# Patient Record
Sex: Male | Born: 1960 | Race: White | Hispanic: No | Marital: Married | Smoking: Never smoker
Health system: Southern US, Community
[De-identification: ages and names within clinical notes are randomized; demographics above are authoritative.]

## PROBLEM LIST (undated history)

## (undated) DIAGNOSIS — I10 Essential (primary) hypertension: Secondary | ICD-10-CM

## (undated) DIAGNOSIS — E119 Type 2 diabetes mellitus without complications: Secondary | ICD-10-CM

## (undated) DIAGNOSIS — E079 Disorder of thyroid, unspecified: Secondary | ICD-10-CM

---

## 1998-06-02 ENCOUNTER — Emergency Department (HOSPITAL_COMMUNITY): Admission: EM | Admit: 1998-06-02 | Discharge: 1998-06-02 | Payer: Self-pay | Admitting: Emergency Medicine

## 2001-03-27 ENCOUNTER — Emergency Department (HOSPITAL_COMMUNITY): Admission: EM | Admit: 2001-03-27 | Discharge: 2001-03-27 | Payer: Self-pay | Admitting: Emergency Medicine

## 2001-05-06 ENCOUNTER — Ambulatory Visit (HOSPITAL_COMMUNITY): Admission: RE | Admit: 2001-05-06 | Discharge: 2001-05-06 | Payer: Self-pay | Admitting: Internal Medicine

## 2006-01-06 ENCOUNTER — Encounter: Admission: RE | Admit: 2006-01-06 | Discharge: 2006-01-06 | Payer: Self-pay | Admitting: Internal Medicine

## 2006-08-18 ENCOUNTER — Encounter: Admission: RE | Admit: 2006-08-18 | Discharge: 2006-08-18 | Payer: Self-pay | Admitting: Internal Medicine

## 2007-12-07 ENCOUNTER — Encounter: Admission: RE | Admit: 2007-12-07 | Discharge: 2007-12-07 | Payer: Self-pay | Admitting: Internal Medicine

## 2009-05-18 IMAGING — CR DG RIBS W/ CHEST 3+V*L*
4 series · 4 of 4 positions shown · non-contrast
Comparison: none

CLINICAL DATA: Left rib pain

Left ribs with chest:
Comparison 01/06/2006. Frontal chest film shows no pneumothorax, effusion, or
contusion. Three detailed views of left ribs with skin marker showed no
displaced fracture or other focal lesion.

[w chest pa]
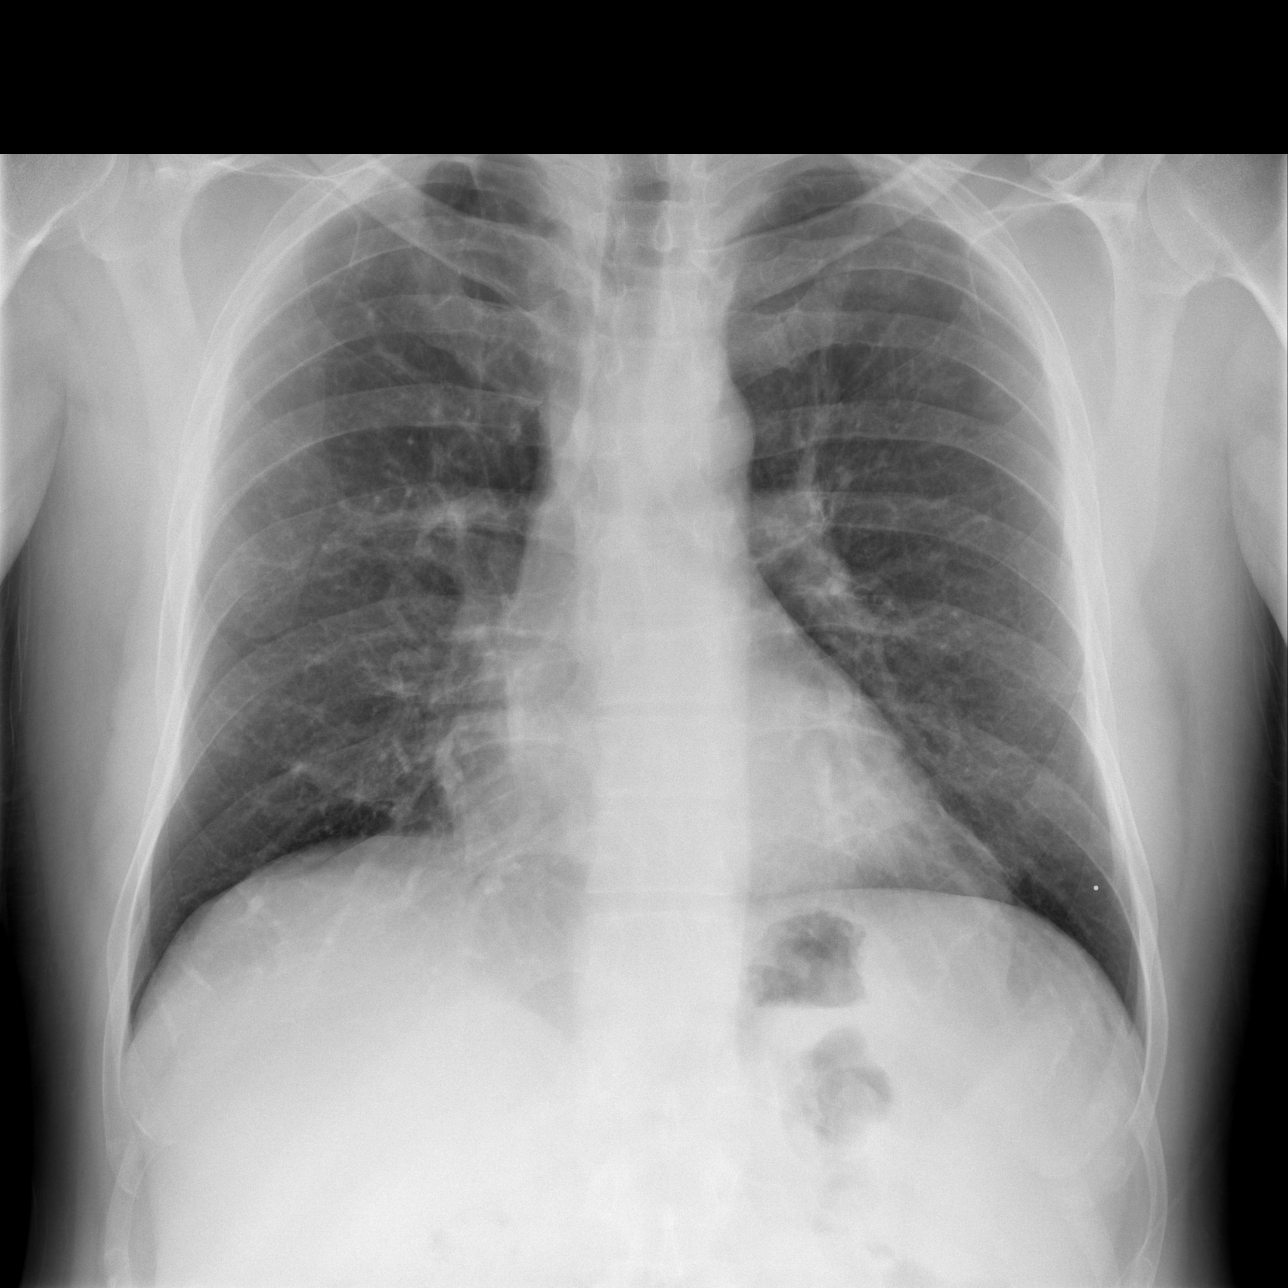

[w ribs ap/pa upper left *]
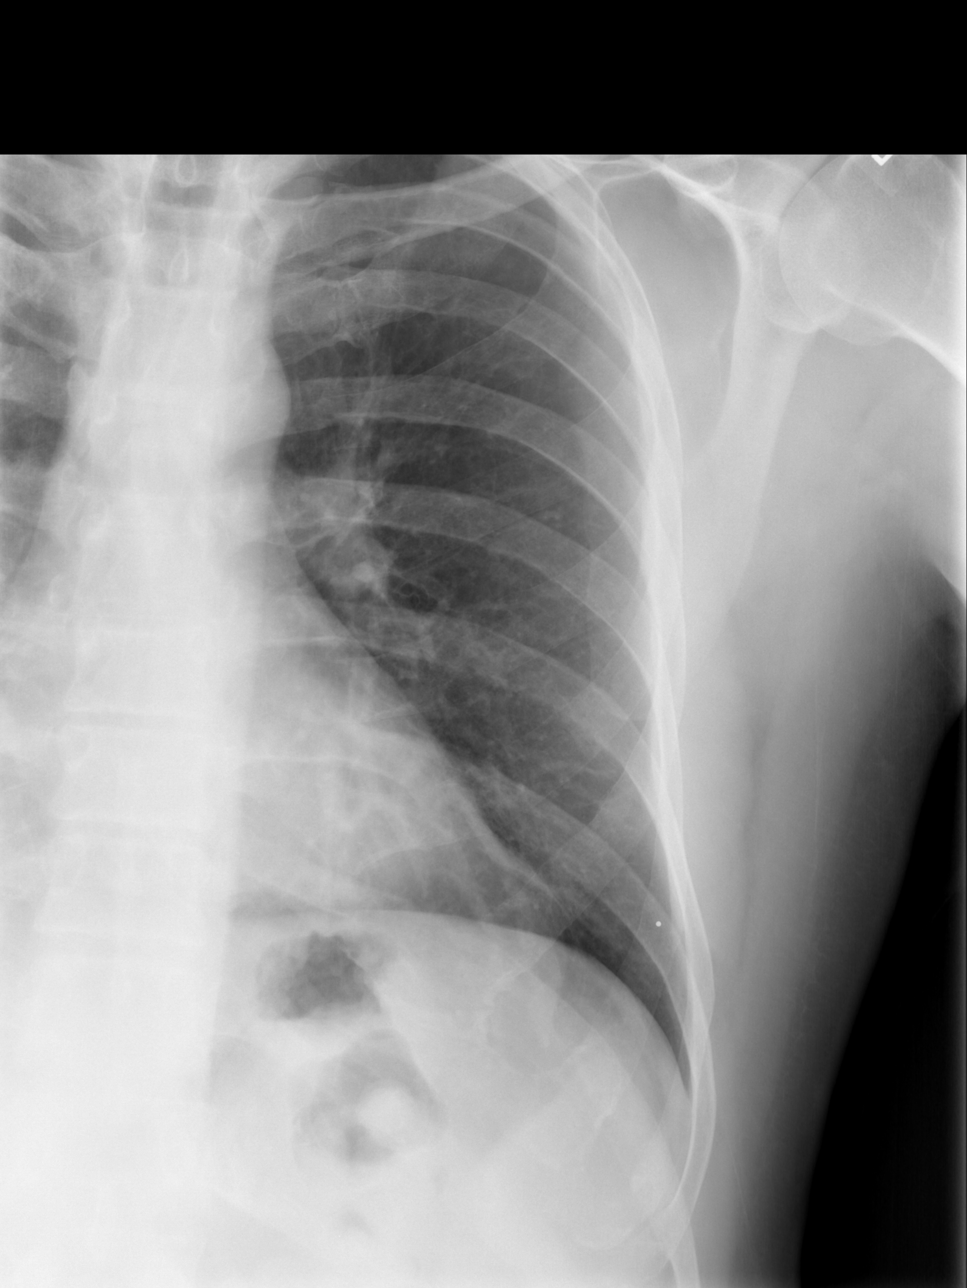

[w ribs ap/pa lower left *]
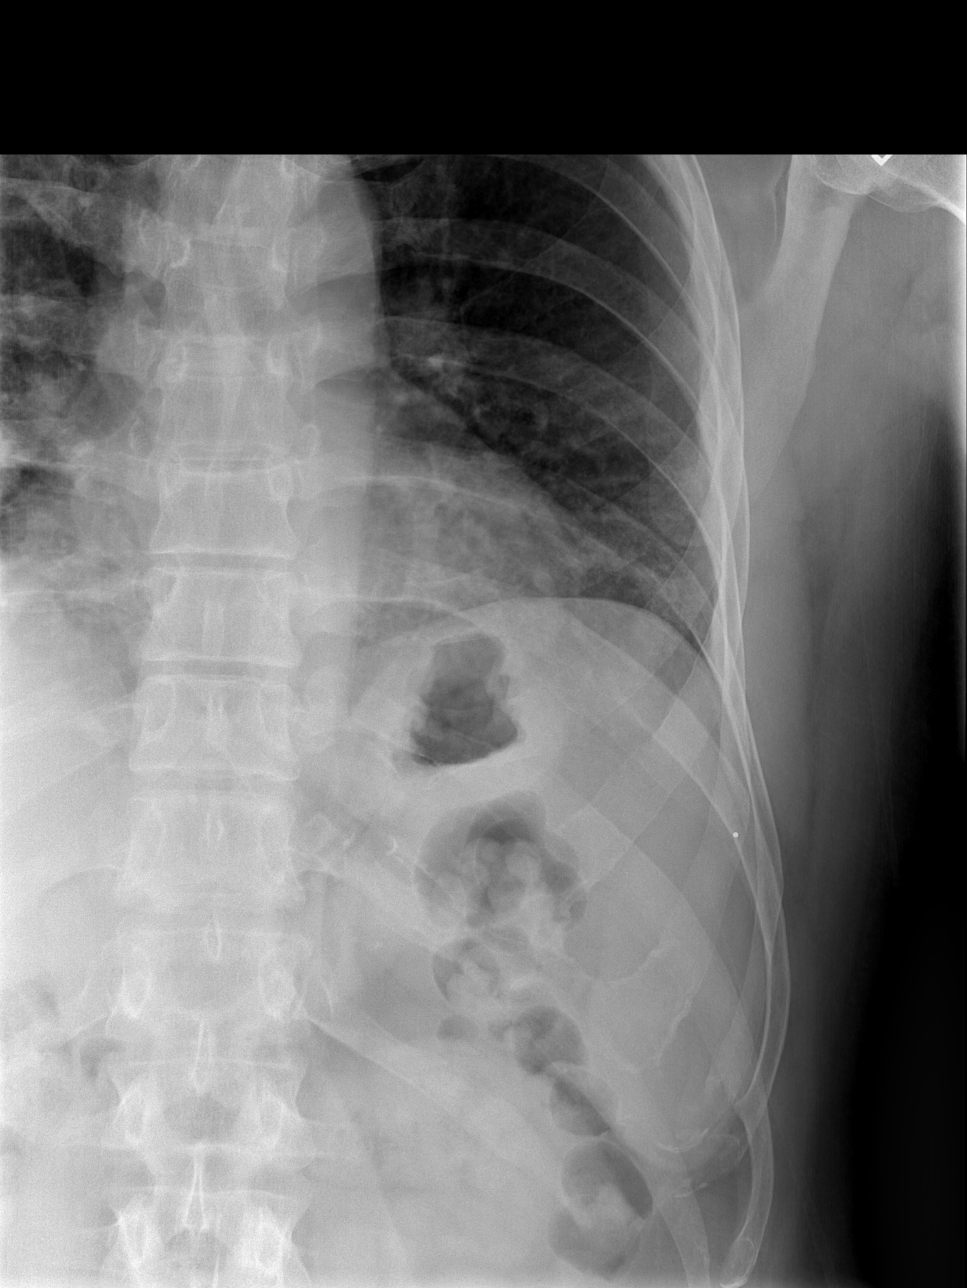

[w ribs oblique left *]
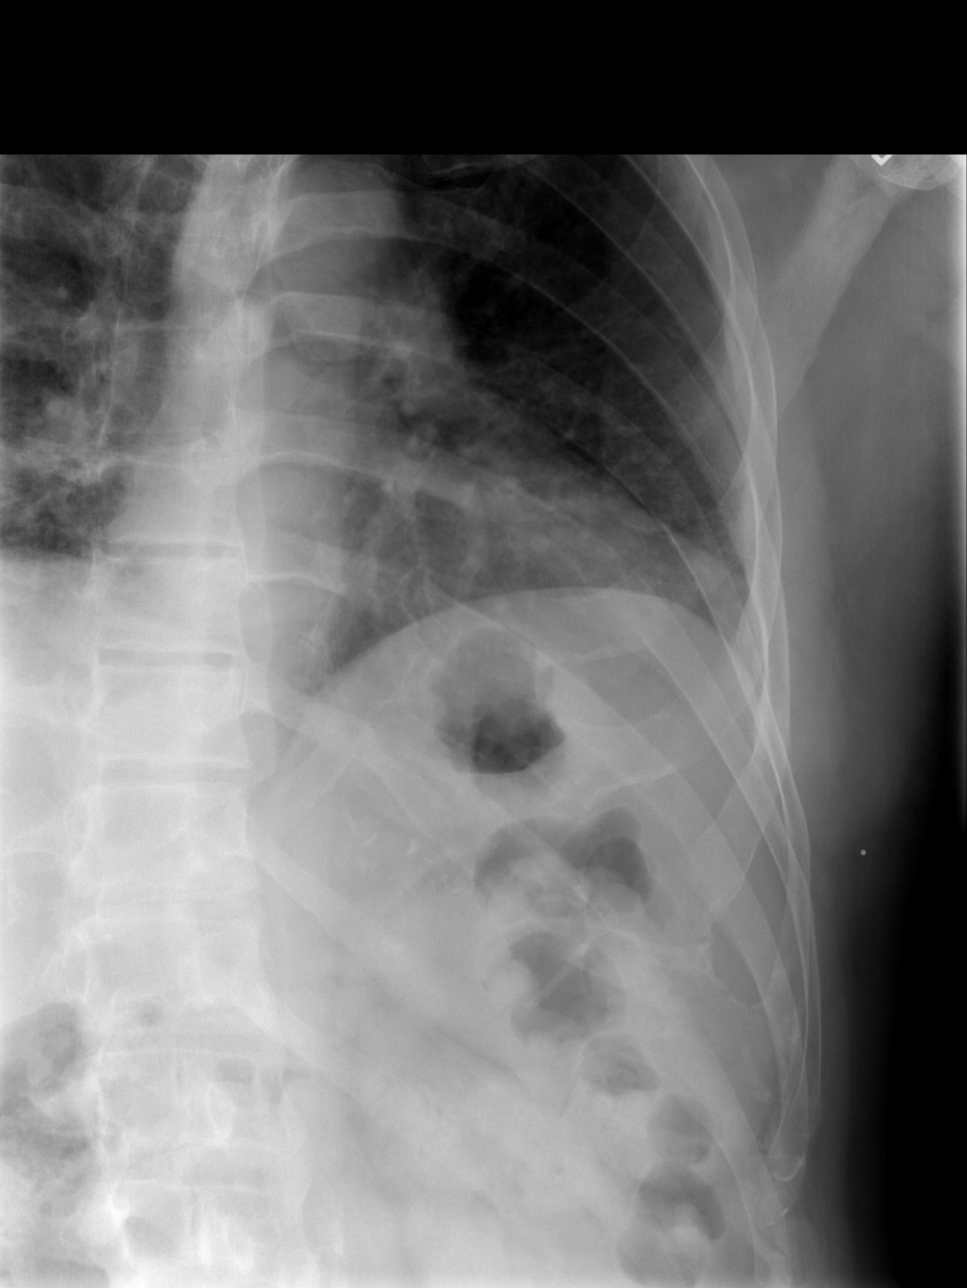

[4 of 4 positions shown; findings below may reference images not displayed]

IMPRESSION: 1. Negative

## 2014-05-02 ENCOUNTER — Encounter: Payer: Self-pay | Admitting: Internal Medicine

## 2016-06-23 ENCOUNTER — Ambulatory Visit (HOSPITAL_COMMUNITY)
Admission: EM | Admit: 2016-06-23 | Discharge: 2016-06-23 | Disposition: A | Discharge status: Home / Self Care | Payer: 59 | Attending: Family Medicine | Admitting: Family Medicine

## 2016-06-23 ENCOUNTER — Encounter (HOSPITAL_COMMUNITY): Payer: Self-pay | Admitting: Family Medicine

## 2016-06-23 DIAGNOSIS — S0501XA Injury of conjunctiva and corneal abrasion without foreign body, right eye, initial encounter: Secondary | ICD-10-CM

## 2016-06-23 MED ORDER — TOBRAMYCIN 0.3 % OP OINT: 1.0000 "application " | TOPICAL_OINTMENT | Freq: Three times a day (TID) | OPHTHALMIC | 0 refills | Status: DC

## 2016-06-23 MED ORDER — TETRACAINE HCL 0.5 % OP SOLN
OPHTHALMIC | Status: AC
  Filled 2016-06-23: qty 2

## 2016-06-23 NOTE — ED Triage Notes (Signed)
Pt reports   He  Was  [employer]   Cutting  Yesterday   And   a  Development worker, communityiece   Of  Material  Got  Into    His  r  Eye  He  Feels  Like  It  May  Be  Scratched   It  Is  irriatted   And  causing  Symptoms

## 2016-06-23 NOTE — ED Provider Notes (Signed)
[facility]    CSN: [id number] Arrival date & time: 06/23/16  1549  First Provider Contact:  First MD Initiated Contact with Patient 06/23/16 1630        History   Chief Complaint Chief Complaint  Patient presents with  . Eye Injury    HPI Ronald Bauer is a 55 y.o. male.   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 The onset of the pain was associated with cutting grass and he believes that a foreign body was able to fly up between his eyeglasses and his eye. He has a foreign body sensation now in the right eye and has blepharospasm when he is out in the sunlight.  He's had no obvious change in his vision. Left eye is unaffected. Eyes been watering ever since.      History reviewed. No pertinent past medical history.  There are no active problems to display for this patient.   History reviewed. No pertinent surgical history.     Home Medications    Prior to Admission medications   Medication Sig Start Date End Date Taking? Authorizing Provider  insulin aspart (NOVOLOG) 100 unit/mL injection Inject 10 Units into the skin 3 (three) times daily before meals.   Yes Historical Provider, MD  simvastatin (ZOCOR) 20 MG tablet Take 20 mg by mouth daily.   Yes Historical Provider, MD  tobramycin (TOBREX) 0.3 % ophthalmic ointment Place 1 application into the right eye 3 (three) times daily. 06/23/16   Elvina SidleKurt Kijuan Gallicchio, MD    Family History History reviewed. No pertinent family history.  Social History Social History  Substance Use Topics  . Smoking status: Not on file  . Smokeless tobacco: Not on file  . Alcohol use Not on file     Allergies   Review of patient's allergies indicates not on file.   Review of Systems Review of Systems   Physical Exam Triage Vital Signs ED Triage Vitals  Enc Vitals Group     BP      Pulse      Resp      Temp      Temp src      SpO2    Weight      Height      Head Circumference      Peak Flow      Pain Score      Pain Loc      Pain Edu?      Excl. in GC?    No data found.   Updated Vital Signs There were no vitals taken for this visit.  Visual Acuity Right Eye Distance:   Left Eye Distance:   Bilateral Distance:    Right Eye Near:   Left Eye Near:    Bilateral Near:     Physical Exam  Constitutional: He appears well-developed and well-nourished.  Eyes:  Inspection of the eye reveals no foreign body. He has some injection and watering. The eyelids are normal. Extraocular movement is normal and pupils are equal and reactive.  Right eye was anesthetized after the lid was everted and no foreign body was seen. Patient complete relief of her pain. DI was then stained with floor seen and patient was examined with a slit lamp. There were 2 small (2-3 mm) areas of blush at 7:00.  I was then irrigated with saline copiously.  Nursing note and vitals reviewed.  UC Treatments / Results  Labs (all labs ordered are listed, but only abnormal results are displayed) Labs Reviewed - No data to display  EKG  EKG Interpretation None       Radiology No results found.  Procedures Procedures (including critical care time)  Medications Ordered in UC Medications - No data to display   Initial Impression / Assessment and Plan / UC Course  I have reviewed the triage vital signs and the nursing notes.  Pertinent labs & imaging results that were available during my care of the patient were reviewed by me and considered in my medical decision making (see chart for details).  Clinical Course   Slit-lamp exam  Final Clinical Impressions(s) / UC Diagnoses   Final diagnoses:  Injury of conjunctiva and corneal abrasion of right eye without foreign body, initial encounter    New Prescriptions New Prescriptions   TOBRAMYCIN (TOBREX) 0.3 % OPHTHALMIC OINTMENT    Place 1 application into the right eye 3 (three)  times daily.     Elvina SidleKurt Maisen Klingler, MD 06/23/16 1705

## 2016-06-23 NOTE — Discharge Instructions (Signed)
Please return if discomfort is not significantly improved by morning.

## 2018-01-08 ENCOUNTER — Emergency Department (HOSPITAL_BASED_OUTPATIENT_CLINIC_OR_DEPARTMENT_OTHER)
Admission: EM | Admit: 2018-01-08 | Discharge: 2018-01-08 | Disposition: A | Discharge status: Home / Self Care | Payer: 59 | Attending: Emergency Medicine | Admitting: Emergency Medicine

## 2018-01-08 ENCOUNTER — Other Ambulatory Visit: Payer: Self-pay

## 2018-01-08 ENCOUNTER — Encounter (HOSPITAL_BASED_OUTPATIENT_CLINIC_OR_DEPARTMENT_OTHER): Payer: Self-pay | Admitting: *Deleted

## 2018-01-08 DIAGNOSIS — Z79899 Other long term (current) drug therapy: Secondary | ICD-10-CM | POA: Diagnosis not present

## 2018-01-08 DIAGNOSIS — E119 Type 2 diabetes mellitus without complications: Secondary | ICD-10-CM | POA: Diagnosis not present

## 2018-01-08 DIAGNOSIS — I10 Essential (primary) hypertension: Secondary | ICD-10-CM | POA: Insufficient documentation

## 2018-01-08 DIAGNOSIS — J069 Acute upper respiratory infection, unspecified: Secondary | ICD-10-CM

## 2018-01-08 DIAGNOSIS — Z794 Long term (current) use of insulin: Secondary | ICD-10-CM | POA: Insufficient documentation

## 2018-01-08 DIAGNOSIS — R05 Cough: Secondary | ICD-10-CM | POA: Diagnosis present

## 2018-01-08 DIAGNOSIS — B9789 Other viral agents as the cause of diseases classified elsewhere: Secondary | ICD-10-CM | POA: Diagnosis not present

## 2018-01-08 HISTORY — DX: Type 2 diabetes mellitus without complications: E11.9

## 2018-01-08 HISTORY — DX: Essential (primary) hypertension: I10

## 2018-01-08 HISTORY — DX: Disorder of thyroid, unspecified: E07.9

## 2018-01-08 LAB — RAPID STREP SCREEN (MED CTR MEBANE ONLY): STREPTOCOCCUS, GROUP A SCREEN (DIRECT): NEGATIVE

## 2018-01-08 MED ORDER — BENZONATATE 100 MG PO CAPS: 100.0000 mg | ORAL_CAPSULE | Freq: Three times a day (TID) | ORAL | 0 refills | Status: DC

## 2018-01-08 MED ORDER — CHLORHEXIDINE GLUCONATE 0.12 % MT SOLN: 15.0000 mL | Freq: Two times a day (BID) | OROMUCOSAL | 0 refills | Status: DC

## 2018-01-08 MED FILL — CHLORHEXIDINE 0.12% RINSE: 0.12 | 16 days supply | Qty: 473 | Fill #0

## 2018-01-08 MED FILL — BENZONATATE 100 MG CAPSULE: 100 | 7 days supply | Qty: 21 | Fill #0

## 2018-01-08 NOTE — ED Provider Notes (Signed)
[facility] EMERGENCY DEPARTMENT Provider Note   CSN: [id number] Arrival date & time: 01/08/18  1430     History   Chief Complaint Chief Complaint  Patient presents with  . Cough    HPI Ronald Bauer is a 57 y.o. male.  HPI   57 year old male with history of diabetes, hypertension, thyroid disease presenting with cold symptoms.  Patient reports since yesterday he noticed scratchy throat, occasional sneeze, and today he also produced a nonproductive cough.  No report of fever, headache, congestion, ear pain, neck pain, chest pain, trouble breathing, hemoptysis.  Wife developed some symptoms.  Patient states he is a strep carrier.  He is requesting of a antibiotic treatment.  He mentioned that his PCP office is closed today.  States he has had his flu shot.  Denies any myalgias.  Past Medical History:  Diagnosis Date  . Diabetes mellitus without complication (HCC)   . Hypertension   . Thyroid disease     There are no active problems to display for this patient.   History reviewed. No pertinent surgical history.     Home Medications    Prior to Admission medications   Medication Sig Start Date End Date Taking? Authorizing Provider  insulin aspart (NOVOLOG) 100 unit/mL injection Inject 10 Units into the skin 3 (three) times daily before meals.   Yes [provider]  Levothyroxine Sodium (SYNTHROID PO) Take by mouth.   Yes [provider]  simvastatin (ZOCOR) 20 MG tablet Take 20 mg by mouth daily.   Yes [provider]  tobramycin (TOBREX) 0.3 % ophthalmic ointment Place 1 application into the right eye 3 (three) times daily. 06/23/16   Elvina SidleLauenstein, Kurt, MD    Family History No family history on file.  Social History Social History   Tobacco Use  . Smoking status: Never Smoker  . Smokeless tobacco: Never Used  Substance Use Topics  . Alcohol use: No    Frequency: Never  . Drug use: No     Allergies   Patient has no known  allergies.   Review of Systems Review of Systems  All other systems reviewed and are negative.    Physical Exam Updated Vital Signs BP (!) 145/66   Pulse 88   Temp 98.7 F (37.1 C) (Oral)   Resp 18   Ht 6' (1.829 m)   Wt 109.8 kg (242 lb)   SpO2 99%   BMI 32.82 kg/m   Physical Exam  Constitutional: He appears well-developed and well-nourished. No distress.  HENT:  Head: Atraumatic.  Ears: TMs normal bilaterally Nose: Normal nares Throat: Uvula midline mild posterior oropharyngeal erythema but no tonsillar enlargement or exudates.  Eyes: Conjunctivae are normal.  Neck: Normal range of motion. Neck supple.  Cardiovascular: Normal rate and regular rhythm.  Pulmonary/Chest: Effort normal and breath sounds normal. No respiratory distress.  Abdominal: Soft. There is no tenderness.  Lymphadenopathy:    He has no cervical adenopathy.  Neurological: He is alert.  Skin: No rash noted.  Psychiatric: He has a normal mood and affect.  Nursing note and vitals reviewed.    ED Treatments / Results  Labs (all labs ordered are listed, but only abnormal results are displayed) Labs Reviewed  RAPID STREP SCREEN (NOT AT [facility])  CULTURE, GROUP A STREP [facility])    EKG  EKG Interpretation None       Radiology No results found.  Procedures Procedures (including critical care time)  Medications Ordered in ED Medications -  No data to display   Initial Impression / Assessment and Plan / ED Course  I have reviewed the triage vital signs and the nursing notes.  Pertinent labs & imaging results that were available during my care of the patient were reviewed by me and considered in my medical decision making (see chart for details).     BP (!) 145/66   Pulse 88   Temp 98.7 F (37.1 C) (Oral)   Resp 18   Ht 6' (1.829 m)   Wt 109.8 kg (242 lb)   SpO2 99%   BMI 32.82 kg/m    Final Clinical Impressions(s) / ED Diagnoses   Final diagnoses:  Viral URI with cough     ED Discharge Orders        Ordered    chlorhexidine (PERIDEX) 0.12 % solution  2 times daily     01/08/18 1535    benzonatate (TESSALON) 100 MG capsule  Every 8 hours     01/08/18 1535     3:01 PM Patient here with cold symptoms.  He is requesting a rapid strep test.  Rapid strep test obtained.  Patient otherwise well-appearing, lungs sounds clear, no fever or hypoxia to suggest pneumonia.  3:36 PM Strep test negative, will provide sxs treatment.  Return precaution given.    Fayrene Helper, PA-C 01/08/18 1537    Raeford Razor, MD 01/08/18 1600

## 2018-01-08 NOTE — ED Triage Notes (Signed)
Cough this am. Scratchy throat yesterday.

## 2018-01-11 LAB — CULTURE, GROUP A STREP (THRC)

## 2020-02-09 ENCOUNTER — Other Ambulatory Visit: Payer: Self-pay | Admitting: Internal Medicine

## 2020-02-09 DIAGNOSIS — R1011 Right upper quadrant pain: Secondary | ICD-10-CM

## 2020-02-11 ENCOUNTER — Ambulatory Visit
Admission: RE | Admit: 2020-02-11 | Discharge: 2020-02-11 | Disposition: A | Discharge status: Home / Self Care | Payer: POS | Source: Ambulatory Visit | Attending: Internal Medicine | Admitting: Internal Medicine

## 2020-02-11 DIAGNOSIS — R1011 Right upper quadrant pain: Secondary | ICD-10-CM

## 2021-07-23 IMAGING — US US ABDOMEN LIMITED
1 series · 14 of 25 positions shown · non-contrast
Comparison: None.

CLINICAL DATA: Right upper quadrant pain

EXAM:
ULTRASOUND ABDOMEN LIMITED RIGHT UPPER QUADRANT

[Series 1: us abdomen limited · 0.30mm/px · 14 of 46 slices shown]
[im 1/46]
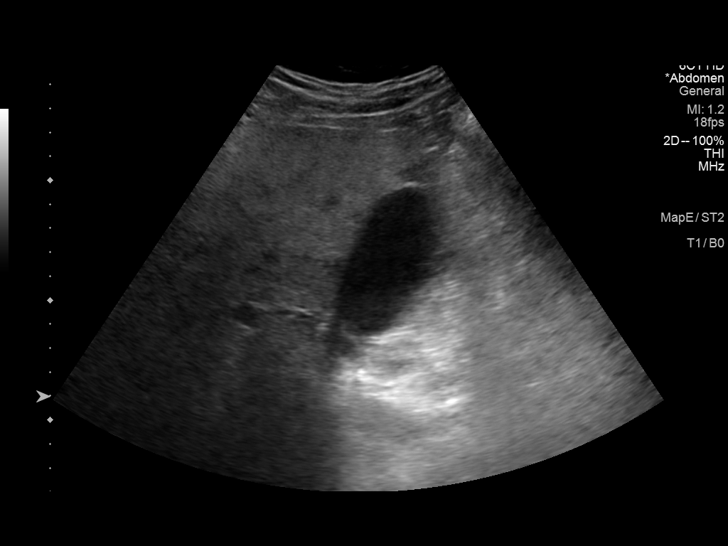
[im 4/46]
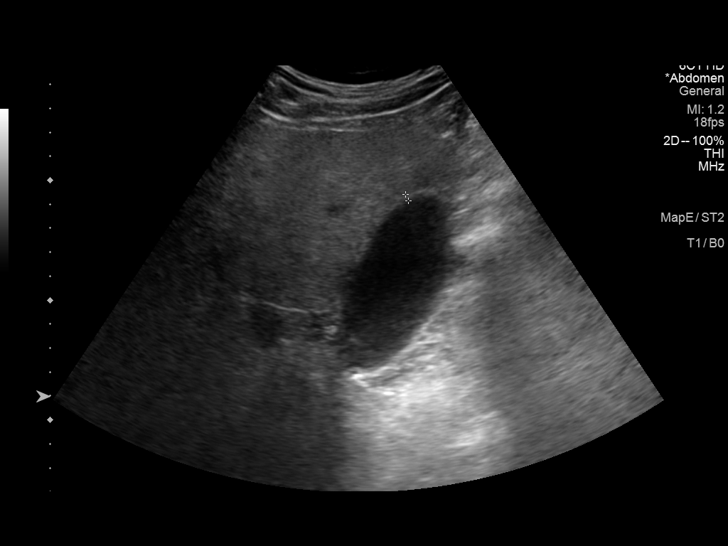
[im 8/46]
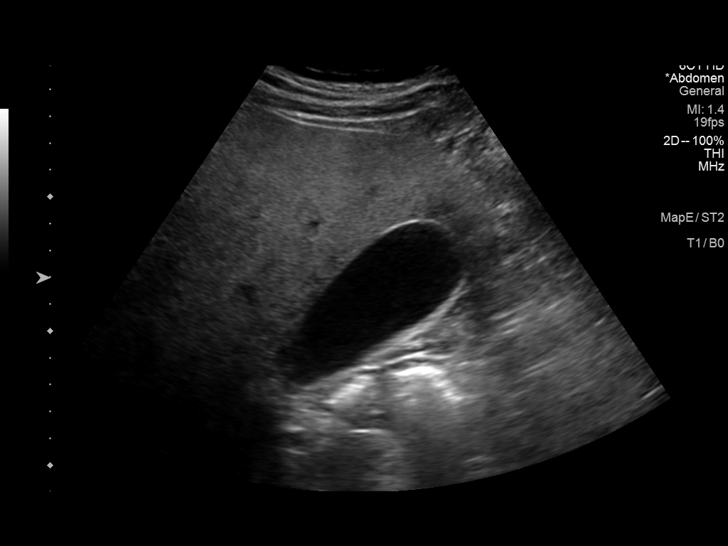
[im 12/46]
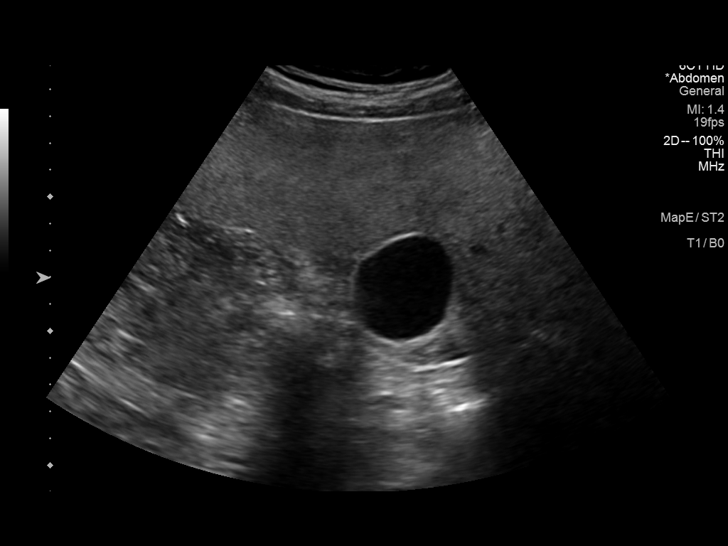
[im 16/46]
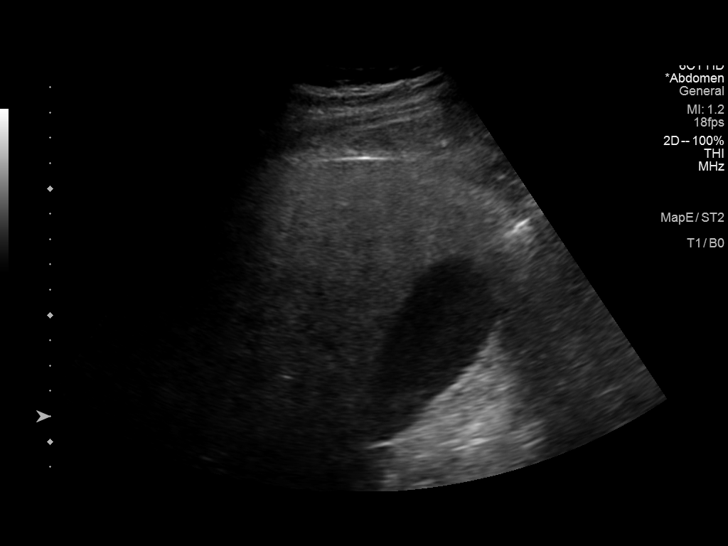
[im 17/46]
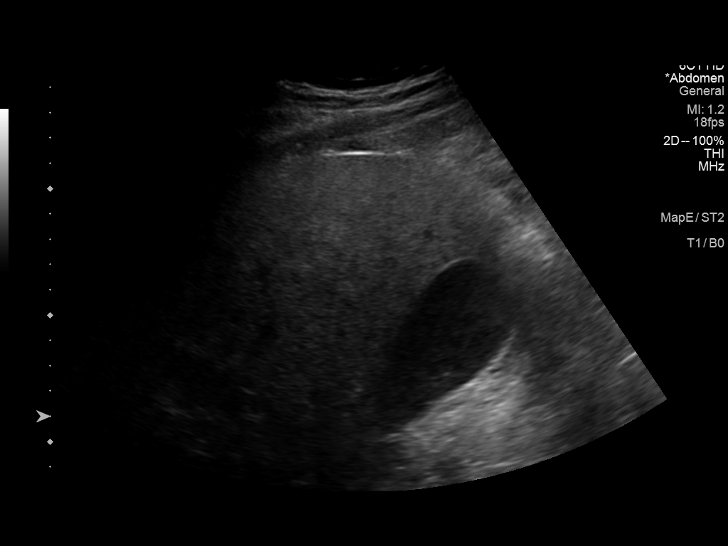
[im 21/46]
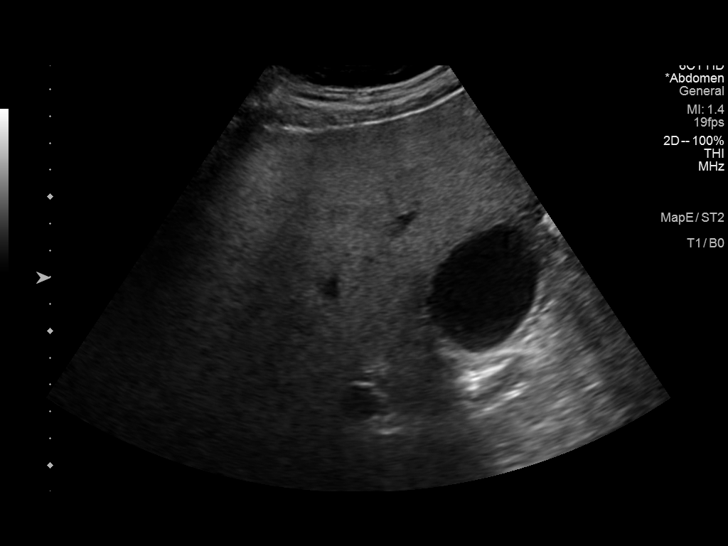
[im 25/46]
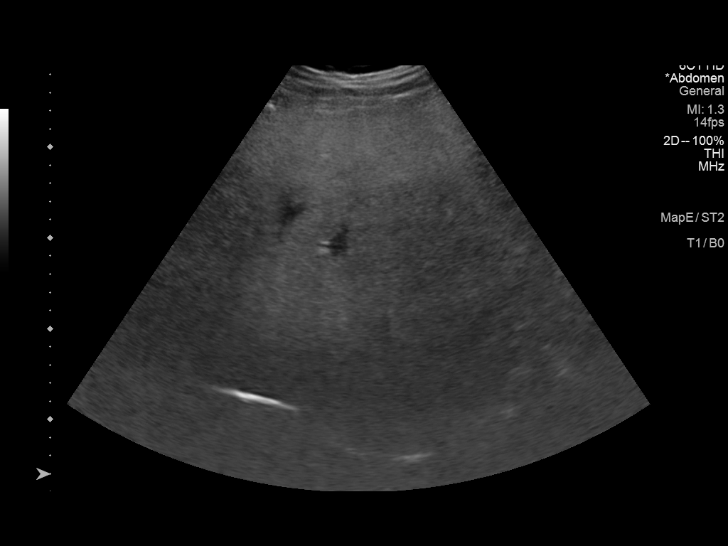
[im 29/46]
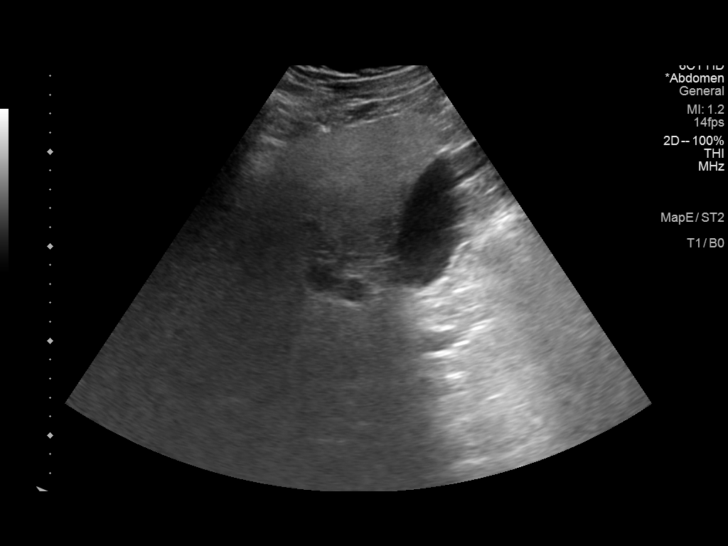
[im 31/46]
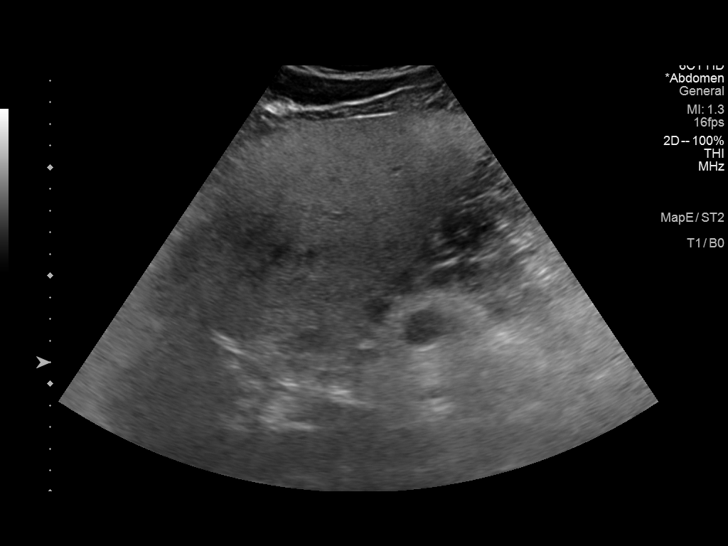
[im 34/46]
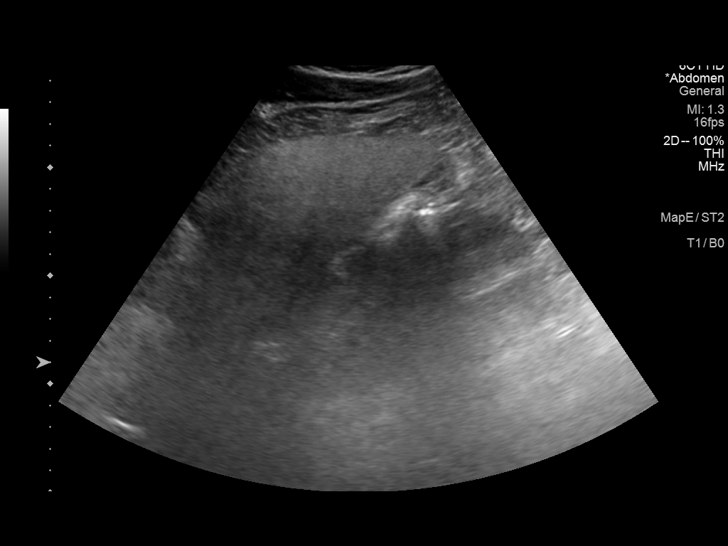
[im 38/46]
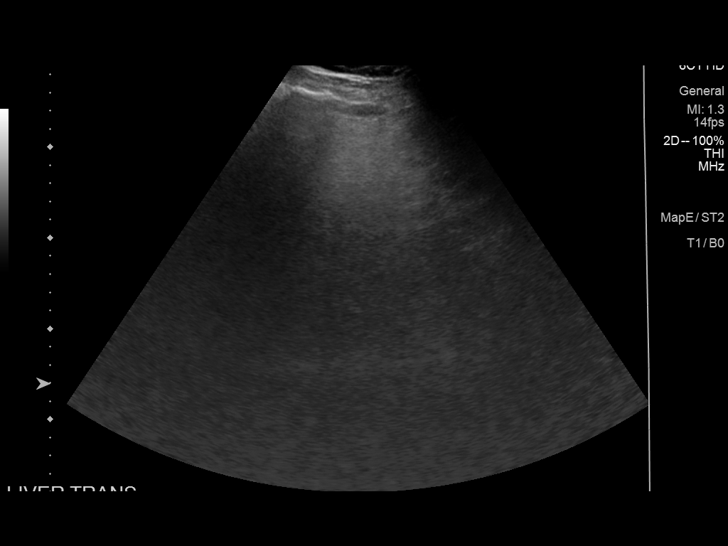
[im 42/46]
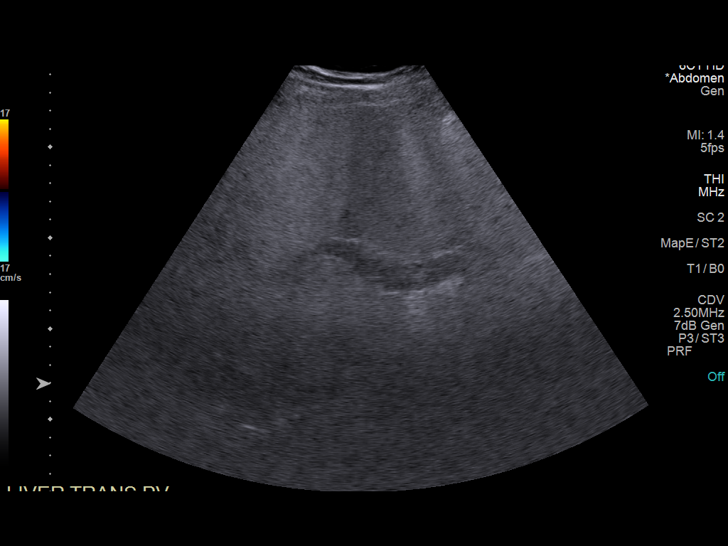
[im 46/46]
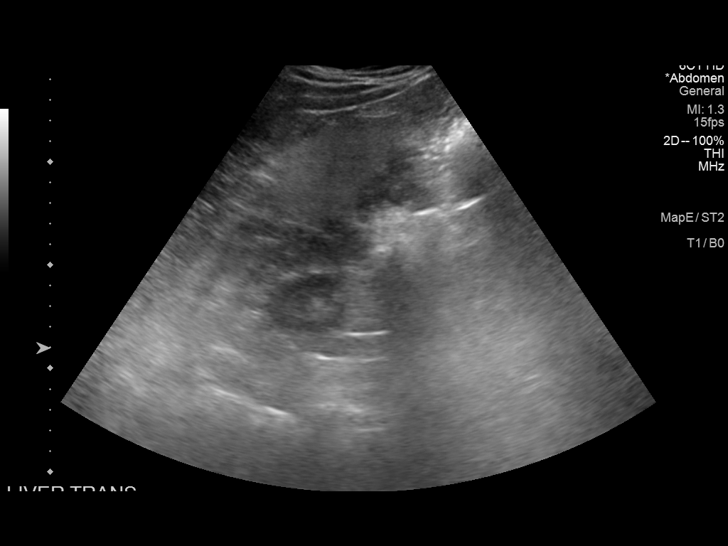

[14 of 25 positions shown; findings below may reference images not displayed]

FINDINGS: Gallbladder:

No gallstones or wall thickening visualized. No sonographic Murphy
sign noted by sonographer.

Common bile duct:

Diameter: 4 mm

Liver:

Heterogeneity is noted with decreased penetration. No focal mass is
seen. Portal vein is patent on color Doppler imaging with normal
direction of blood flow towards the liver.

Other: None.
IMPRESSION: Heterogeneity within the liver likely related to fatty infiltration.
No focal mass is seen. No other focal abnormality is noted.

## 2024-06-16 LAB — LAB REPORT - SCANNED
A1c: 7.1
EGFR: 84
TSH: 1.87 (ref 0.41–5.90)

## 2024-06-24 ENCOUNTER — Ambulatory Visit: Payer: Self-pay | Admitting: Internal Medicine

## 2024-06-24 NOTE — Progress Notes (Signed)
 This patient is not a [facility] end[facility]inology patient, please forward to the appropriate provider

## 2024-07-08 ENCOUNTER — Ambulatory Visit (HOSPITAL_BASED_OUTPATIENT_CLINIC_OR_DEPARTMENT_OTHER): Admitting: Cardiology

## 2024-07-21 ENCOUNTER — Ambulatory Visit: Admitting: Internal Medicine

## 2024-07-26 ENCOUNTER — Encounter (HOSPITAL_BASED_OUTPATIENT_CLINIC_OR_DEPARTMENT_OTHER): Payer: Self-pay

## 2024-07-27 ENCOUNTER — Encounter (HOSPITAL_BASED_OUTPATIENT_CLINIC_OR_DEPARTMENT_OTHER): Payer: Self-pay | Admitting: Cardiology

## 2024-07-27 ENCOUNTER — Ambulatory Visit (HOSPITAL_BASED_OUTPATIENT_CLINIC_OR_DEPARTMENT_OTHER): Admitting: Cardiology

## 2024-07-27 VITALS — BP 118/60 | HR 74 | Ht 72.0 in | Wt 288.0 lb

## 2024-07-27 DIAGNOSIS — I447 Left bundle-branch block, unspecified: Secondary | ICD-10-CM

## 2024-07-27 DIAGNOSIS — Z713 Dietary counseling and surveillance: Secondary | ICD-10-CM

## 2024-07-27 DIAGNOSIS — E119 Type 2 diabetes mellitus without complications: Secondary | ICD-10-CM

## 2024-07-27 DIAGNOSIS — Z794 Long term (current) use of insulin: Secondary | ICD-10-CM

## 2024-07-27 DIAGNOSIS — Z7189 Other specified counseling: Secondary | ICD-10-CM

## 2024-07-27 DIAGNOSIS — I10 Essential (primary) hypertension: Secondary | ICD-10-CM

## 2024-07-27 DIAGNOSIS — Z7182 Exercise counseling: Secondary | ICD-10-CM

## 2024-07-27 DIAGNOSIS — Z8249 Family history of ischemic heart disease and other diseases of the circulatory system: Secondary | ICD-10-CM | POA: Diagnosis not present

## 2024-07-27 DIAGNOSIS — E781 Pure hyperglyceridemia: Secondary | ICD-10-CM

## 2024-07-27 NOTE — Progress Notes (Signed)
 Cardiology Office Note:  .   Date:  07/27/2024  ID:  Ronald Bauer, DOB August 03, 1961, MRN [id number] PCP: [name] Ingle, MD  [facility] Providers Cardiologist:  Shelda Bruckner, MD {  History of Present Illness: .   Ronald Bauer is a 63 y.o. male with PMH hypertension, type II diabetes, dyslipidemia, obesity who is seen as a new patient for LBBB at the request of Dr. [name].  Referral from 06/24/24 reviewed. Referral packet from Dr [name] showed ECG from 06/16/23 with SR, QRS 84 ms. ECG from 06-15-24 shows SR with new LBBB and QRS of 161 ms. No symptoms noted. Tchol 291, HDL 28, TG 1262, LDL unable to be calculated.  Here with his wife. Has always felt tired, not a new symptom. Largely sedentary, bowls on Tuesday nights. There are 3 floors in the house but he rarely has to climb stairs. Usually in recliner watches TV or in bed.   No chest pain, mild shortness of breath with exertion that is stable. Has mild LE edema that is chronic and stable.  Has never been told he has heart issues until this recent appointment.  Has had diabetes since his mid 30s (weighed <150 lbs when he was diagnosed). Has been on insulin for about 30 years (shortly after diagnosis), last A1c 7.1 06/2024. Hypertension has been a more recent issue, only in the last few years. Former tobacco, quit ~30 years ago. Cholesterol has been gradually worsening over time. Admits that diet is poor. Only on fenofibrate. Has never had pancreatitis. Was on simvastatin in the past, tolerated. Stopped when he was changed to fenofibrate. Has endocrine appt upcoming for his triglyceride evaluation on 10/1.  Reviewed FH, father had hypertension, diabetes, high cholesterol, MI. Diabetes runs throughout family, most of them have passed 2/2 complications from diabetes.  ROS:  No PND, orthopnea, worsening LE edema or unexpected weight gain. No syncope or palpitations. ROS otherwise negative except as noted.   Studies Reviewed: SABRA     EKG:       Physical Exam:   VS:  BP 118/60   Pulse 74   Ht 6' (1.829 m)   Wt 288 lb (130.6 kg)   SpO2 95%   BMI 39.06 kg/m    Wt Readings from Last 3 Encounters:  07/27/24 288 lb (130.6 kg)  01/08/18 242 lb (109.8 kg)    GEN: Well nourished, well developed in no acute distress HEENT: Normal, moist mucous membranes NECK: No JVD CARDIAC: regular rhythm, normal S1 and S2, no rubs or gallops. No murmur. VASCULAR: Radial and DP pulses 2+ bilaterally. No carotid bruits RESPIRATORY:  Clear to auscultation without rales, wheezing or rhonchi  ABDOMEN: Soft, non-tender, non-distended MUSCULOSKELETAL:  Ambulates independently SKIN: Warm and dry, no edema NEUROLOGIC:  Alert and oriented x 3. No focal neuro deficits noted. PSYCHIATRIC:  Normal affect    ASSESSMENT AND PLAN: .    New LBBB Type II diabetes, on insulin for ~30 years Dyslipidemia, with severely elevated TG FH of heart disease -has never been told he has heart issues, has not had chest pain, but minimally active -concern is for ischemia. Given body habitus, lack of symptoms, suspicion for significant disease, will focus on perfusion to see if there is reversible ischemia. Suspect that CT would show diffuse disease, so perfusion will be more helpful to determine ischemia/reversibility -discussed LBBB at length, anatomy, risk of cardiomyopathy -check echo to evaluate EF/dyssynchrony -severely elevated TG, on fenofibrate. He was remotely on statin, but  this was changed to fenofibrate. We discussed goals for diet, exercise as below. Suspect that there will need to be significant lifestyle change before fibrate can be most effective. We also discussed that fibrate does not replace statin from a risk management perspective. I recommend statin, lifestyle change, fibrate, and if no significant improvement, referral to our lipid clinic for possible genetic evaluation or olezarsen. He will meet with endocrinologist in about a week to  discuss as well. Will hold on making changes until after that visit. -reviewed red flag warning signs that need immediate medical attention -suspect metabolic syndrome with abdominal adiposity, BMI 39, type II diabetes, dyslipidemia, hypertension. I would typically like GLP1 in this case, but with severely elevated TG, my concern is risk of pancreatitis (though he has not had previously). The counterpoint is that GLP may improve TG with metabolic changes/weight loss. Will see what is discussed at his endocrinology appt.  Informed Consent   Shared Decision Making/Informed Consent{  The risks [chest pain, shortness of breath, cardiac arrhythmias, dizziness, blood pressure fluctuations, myocardial infarction, stroke/transient ischemic attack, nausea, vomiting, allergic reaction, radiation exposure, metallic taste sensation and life-threatening complications (estimated to be 1 in 10,000)], benefits (risk stratification, diagnosing coronary artery disease, treatment guidance) and alternatives of a cardiac PET stress test were discussed in detail with Mr. Warshawsky and he agrees to proceed.       Hypertension -well controlled on amlodipine 10 mg daily, lisinopril-hydrochlorothiazide 20-25 mg daily  CV risk counseling and prevention -recommend heart healthy/Mediterranean diet, with whole grains, fruits, vegetable, fish, lean meats, nuts, and olive oil. Limit salt. -recommend moderate walking, 3-5 times/week for 30-50 minutes each session. Aim for at least 150 minutes/week. Goal should be pace of 3 miles/hours, or walking 1.5 miles in 30 minutes -recommend avoidance of tobacco products. Avoid excess alcohol.  Dispo: 2 mos to review test results, endocrinology recommendations  Total time of encounter: I spent 61 minutes dedicated to the care of this patient on the date of this encounter to include pre-visit review of records, face-to-face time with the patient discussing conditions above, and clinical  documentation with the electronic health record. We specifically spent time today discussing LBBB, discussion of potential ischemia as etiology, reviewed of risks of cardiomyopathy with LBBB, discussion of other CV risk factors including lipids, type II diabetes, hypertension, and lifestyle, reviewed recommendations for all of the above   Signed, Shelda Bruckner, MD   Shelda Bruckner, MD, PhD, [facility]  [facility]  [place]  Heart & Vascular at [facility] at [facility] [address] [place], [place] [address] [phone]

## 2024-07-27 NOTE — Patient Instructions (Addendum)
 Medication Instructions:  No changes today *If you need a refill on your cardiac medications before your next appointment, please call your pharmacy*  Lab Work: None today If you have labs (blood work) drawn today and your tests are completely normal, you will receive your results only by: MyChart Message (if you have MyChart) OR A paper copy in the mail If you have any lab test that is abnormal or we need to change your treatment, we will call you to review the results.  Testing/Procedures: Your physician has requested that you have an echocardiogram. Echocardiography is a painless test that uses sound waves to create images of your heart. It provides your doctor with information about the size and shape of your heart and how well your heart's chambers and valves are working. This procedure takes approximately one hour. There are no restrictions for this procedure. Please do NOT wear cologne, perfume, aftershave, or lotions (deodorant is allowed). Please arrive 15 minutes prior to your appointment time.  Please note: We ask at that you not bring children with you during ultrasound (echo/ vascular) testing. Due to room size and safety concerns, children are not allowed in the ultrasound rooms during exams. Our front office staff cannot provide observation of children in our lobby area while testing is being conducted. An adult accompanying a patient to their appointment will only be allowed in the ultrasound room at the discretion of the ultrasound technician under special circumstances. We apologize for any inconvenience.  Cardiac PET CT Scan - see instructions below  Follow-Up: At [facility], you and your health needs are our priority.  As part of our continuing mission to provide you with exceptional heart care, our providers are all part of one team.  This team includes your primary Cardiologist (physician) and Advanced Practice Providers or APPs (Physician Assistants and Nurse  Practitioners) who all work together to provide you with the care you need, when you need it.  Your next appointment:   8 week(s)  Provider:   Shelda Bruckner, MD     Other Instructions    Please report to Radiology at the [facility] Main Entrance 30 minutes early for your test.  [address] [place], [place] [address]                         OR   Please report to Radiology at [facility] Main Entrance, medical mall, 30 mins prior to your test.  [address]  [place], [place]  How to Prepare for Your Cardiac PET/CT Stress Test:  Nothing to eat or drink, except water, 3 hours prior to arrival time.  NO caffeine/decaffeinated products, or chocolate 12 hours prior to arrival. (Please note decaffeinated beverages (teas/coffees) still contain caffeine).  If you have caffeine within 12 hours prior, the test will need to be rescheduled.  Medication instructions: Do not take erectile dysfunction medications for 72 hours prior to test (sildenafil, tadalafil) Do not take nitrates (isosorbide mononitrate, Ranexa) the day before or day of test Do not take tamsulosin the day before or morning of test  Diabetic Preparation: If able to eat breakfast prior to 3 hour fasting, you may take all medications, including your insulin. Do not worry if you miss your breakfast dose of insulin - start at your next meal. If you do not eat prior to 3 hour fast-Hold all diabetes (oral and insulin) medications. Patients who wear a continuous glucose  monitor MUST remove the device prior to scanning.  You may take your remaining medications with water.  NO perfume, cologne or lotion on chest or abdomen area.  Total time is 1 to 2 hours; you may want to bring reading material for the waiting time.  IF YOU THINK YOU MAY BE PREGNANT, OR ARE NURSING PLEASE INFORM THE TECHNOLOGIST.  In preparation for your appointment, medication and supplies will be  purchased.  Appointment availability is limited, so if you need to cancel or reschedule, please call the Radiology Department Scheduler at [phone] 24 hours in advance to avoid a cancellation fee of $100.00  What to Expect When you Arrive:  Once you arrive and check in for your appointment, you will be taken to a preparation room within the Radiology Department.  A technologist or Nurse will obtain your medical history, verify that you are correctly prepped for the exam, and explain the procedure.  Afterwards, an IV will be started in your arm and electrodes will be placed on your skin for EKG monitoring during the stress portion of the exam. Then you will be escorted to the PET/CT scanner.  There, staff will get you positioned on the scanner and obtain a blood pressure and EKG.  During the exam, you will continue to be connected to the EKG and blood pressure machines.  A small, safe amount of a radioactive tracer will be injected in your IV to obtain a series of pictures of your heart along with an injection of a stress agent.    After your Exam:  It is recommended that you eat a meal and drink a caffeinated beverage to counter act any effects of the stress agent.  Drink plenty of fluids for the remainder of the day and urinate frequently for the first couple of hours after the exam.  Your doctor will inform you of your test results within 7-10 business days.  For more information and frequently asked questions, please visit our website: [email or web address]  For questions about your test or how to prepare for your test, please call: Cardiac Imaging Nurse Navigators Office: [phone]

## 2024-08-04 ENCOUNTER — Encounter: Payer: Self-pay | Admitting: Internal Medicine

## 2024-08-04 ENCOUNTER — Ambulatory Visit (INDEPENDENT_AMBULATORY_CARE_PROVIDER_SITE_OTHER): Admitting: Internal Medicine

## 2024-08-04 VITALS — BP 130/70 | HR 77 | Ht 72.0 in | Wt 285.4 lb

## 2024-08-04 DIAGNOSIS — E1165 Type 2 diabetes mellitus with hyperglycemia: Secondary | ICD-10-CM

## 2024-08-04 DIAGNOSIS — Z794 Long term (current) use of insulin: Secondary | ICD-10-CM | POA: Diagnosis not present

## 2024-08-04 DIAGNOSIS — E785 Hyperlipidemia, unspecified: Secondary | ICD-10-CM | POA: Diagnosis not present

## 2024-08-04 MED ORDER — HUMALOG KWIKPEN 200 UNIT/ML ~~LOC~~ SOPN: 30.0000 [IU] | PEN_INJECTOR | Freq: Three times a day (TID) | SUBCUTANEOUS | 4 refills | Status: DC

## 2024-08-04 MED ORDER — DEXCOM G7 SENSOR MISC: 1.0000 | 3 refills | Status: AC

## 2024-08-04 MED ORDER — ACCU-CHEK GUIDE W/DEVICE KIT: 1.0000 | PACK | Freq: Three times a day (TID) | 0 refills | Status: DC

## 2024-08-04 MED ORDER — ROSUVASTATIN CALCIUM 20 MG PO TABS: 20.0000 mg | ORAL_TABLET | Freq: Every day | ORAL | 3 refills | Status: AC

## 2024-08-04 MED ORDER — FENOFIBRATE 200 MG PO CAPS: 200.0000 mg | ORAL_CAPSULE | Freq: Every day | ORAL | 3 refills | Status: AC

## 2024-08-04 MED ORDER — ACCU-CHEK GUIDE TEST VI STRP: 1.0000 | ORAL_STRIP | Freq: Three times a day (TID) | 12 refills | Status: DC

## 2024-08-04 MED ORDER — INSULIN PEN NEEDLE 32G X 4 MM MISC: 1.0000 | Freq: Four times a day (QID) | 3 refills | Status: AC

## 2024-08-04 MED ORDER — TOUJEO MAX SOLOSTAR 300 UNIT/ML ~~LOC~~ SOPN: 100.0000 [IU] | PEN_INJECTOR | Freq: Every day | SUBCUTANEOUS | 3 refills | Status: AC

## 2024-08-04 NOTE — Patient Instructions (Addendum)
 Stop Novolin-N Stop Novolin-R Take Toujeo 100 units once daily Take Humalog 30 units before each meal Humalog correctional insulin: ADD extra units on insulin to your meal-time Humalog dose if your blood sugars are higher than 150. Use the scale below to help guide you:   Blood sugar before meal Number of units to inject  Less than 150 0 unit  151 -  170 1 units  171 -  190 2 units  191 -  210 3 units  211 -  230 4 units  231 -  250 5 units  251 -  270 6 units  271 -  290 7 units  291 -  310 8 units  311 - 330 9 units  331 - 350 10 units  351 - 370 11 units     HOW TO TREAT LOW BLOOD SUGARS (Blood sugar LESS THAN 70 MG/DL) Please follow the RULE OF 15 for the treatment of hypoglycemia treatment (when your (blood sugars are less than 70 mg/dL)   STEP 1: Take 15 grams of carbohydrates when your blood sugar is low, which includes:  3-4 GLUCOSE TABS  OR 3-4 OZ OF JUICE OR REGULAR SODA OR ONE TUBE OF GLUCOSE GEL    STEP 2: RECHECK blood sugar in 15 MINUTES STEP 3: If your blood sugar is still low at the 15 minute recheck --> then, go back to STEP 1 and treat AGAIN with another 15 grams of carbohydrates.

## 2024-08-04 NOTE — Progress Notes (Signed)
 Name: Ronald Bauer  MRN/ DOB: [id number], May 24, 1961   Age/ Sex: 63 y.o., male    PCP: [name] Ingle, MD   Reason for Endocrinology Evaluation: Type 2 Diabetes Mellitus     Date of Initial Endocrinology Visit: 08/04/2024     PATIENT IDENTIFIER: Ronald Bauer is a 63 y.o. male with a past medical history of DM, dyslipidemia and HTN. The patient presented for initial endocrinology clinic visit on 08/04/2024 for consultative assistance with his diabetes management.    HPI: Ronald Bauer was    Diagnosed with DM since his 30s.  Prior Medications tried/Intolerance: He has been on insulin shortly after his diagnosis Currently checking blood sugars 0 x / day Hypoglycemia episodes : Unknown  Hemoglobin A1c has ranged from 7.1% in 06/2024   Patient follows with cardiology for left bundle branch block  No history of pancreatitis Patient used to be on simvastatin, but this was discontinued when he was started on fenofibrate He is not on a low fat diet  He does drink sweet tea but otherwise he drinks diet sodas The patient is out of strips He uses Humulin/Novolin when he is out of his regular insulin  He eats 2 meals a day, snacks occasionally    HOME ENDOCRINE REGIMEN: Novolin- N 50 units twice daily Humulin-R 40 units twice a day  Levothyroxine 50 mcg daily Fenofibrate  145 mg daily     Statin: no ACE-I/ARB: yes Prior Diabetic Education: yes    METER DOWNLOAD SUMMARY: n/a   DIABETIC COMPLICATIONS: Microvascular complications:   Denies: CKD Last eye exam: Completed 08/15/24  Macrovascular complications:   Denies: CAD, PVD, CVA   PAST HISTORY: Past Medical History:  Past Medical History:  Diagnosis Date   Diabetes mellitus without complication (HCC)    Hypertension    Thyroid disease    Past Surgical History: No past surgical history on file.  Social History:  reports that he has never smoked. He has never used smokeless tobacco. He reports that he does not drink  alcohol and does not use drugs. Family History:  Family History  Problem Relation Age of Onset   Hyperlipidemia Father    Hypertension Father    Diabetes Father    Heart attack Father        Cause of death at 5   Stroke Paternal Grandmother    Cancer Paternal Aunt        possible lung. cause of death 08-15-2024   Cancer Cousin        colon     HOME MEDICATIONS: Allergies as of 08/04/2024       Reactions   Other    Seasonal allergies - sneezing watery eyes        Medication List        Accurate as of August 04, 2024  3:23 PM. If you have any questions, ask your nurse or doctor.          STOP taking these medications    fenofibrate 145 MG tablet Commonly known as: TRICOR Stopped by: Diana Armijo J Krish Bailly   HumuLIN R 100 UNIT/ML injection Generic drug: insulin regular Stopped by: Shawnee Gambone J Kennedie Pardoe   insulin aspart 100 unit/mL injection Commonly known as: novoLOG Stopped by: Laketa Sandoz J Lanny Donoso   insulin NPH Human 100 UNIT/ML injection Commonly known as: NOVOLIN N Stopped by: Dessie Delcarlo J Psalm Arman       TAKE these medications    Accu-Chek Guide Test test strip Generic drug: glucose blood 1  each by Other route 3 (three) times daily. Use as instructed Started by: Kelda Azad J Ginamarie Banfield   Accu-Chek Guide w/Device Kit 1 Device by Does not apply route 3 (three) times daily. Started by: Donell PARAS Leyli Kevorkian   amLODipine 10 MG tablet Commonly known as: NORVASC Take 10 mg by mouth daily.   cetirizine 10 MG tablet Commonly known as: ZYRTEC Take 10 mg by mouth daily.   chlorhexidine  0.12 % solution Commonly known as: Peridex  Use as directed 15 mLs in the mouth or throat 2 (two) times daily.   Dexcom G7 Sensor Misc 1 Device by Does not apply route as directed. Change sensor every 10 days Started by: Genaro Bekker J Edwardine Deschepper   fenofibrate micronized 200 MG capsule Commonly known as: LOFIBRA Take 1 capsule (200 mg total) by mouth daily before  breakfast. Started by: Atziry Baranski J Anallely Rosell   HumaLOG KwikPen 200 UNIT/ML KwikPen Generic drug: insulin lispro Inject 30 Units into the skin 3 (three) times daily. Started by: Margree Gimbel J Maleik Vanderzee   HYDROcodone-acetaminophen 7.5-325 MG tablet Commonly known as: NORCO Take 1 tablet by mouth 3 (three) times daily.   ibuprofen 600 MG tablet Commonly known as: ADVIL Take 600 mg by mouth every 8 (eight) hours as needed (pain).   Insulin Pen Needle 32G X 4 MM Misc 1 Device by Does not apply route in the morning, at noon, in the evening, and at bedtime. Started by: Karianne Nogueira J Armida Vickroy   lisinopril-hydrochlorothiazide 20-25 MG tablet Commonly known as: ZESTORETIC Take 1 tablet by mouth daily.   rosuvastatin 20 MG tablet Commonly known as: Crestor Take 1 tablet (20 mg total) by mouth daily. Started by: Prince Olivier J Aurther Harlin   sertraline 50 MG tablet Commonly known as: ZOLOFT Take 50 mg by mouth daily.   SYNTHROID PO Take 50 mcg by mouth daily.   Toujeo Max SoloStar 300 UNIT/ML Solostar Pen Generic drug: insulin glargine (2 Unit Dial) Inject 100 Units into the skin daily in the afternoon. Started by: Donell PARAS Takiesha Mcdevitt         ALLERGIES: Allergies  Allergen Reactions   Other     Seasonal allergies - sneezing watery eyes     REVIEW OF SYSTEMS: A comprehensive ROS was conducted with the patient and is negative except as per HPI    OBJECTIVE:   VITAL SIGNS: BP 130/70 (BP Location: Left Arm, Patient Position: Sitting, Cuff Size: Large)   Pulse 77   Ht 6' (1.829 m)   Wt 285 lb 6.4 oz (129.5 kg)   SpO2 96%   BMI 38.71 kg/m    PHYSICAL EXAM:  General: Pt appears well and is in NAD  Neck: General: Supple without adenopathy or carotid bruits. Thyroid: Thyroid size normal.  No goiter or nodules appreciated.   Lungs: Clear with good BS bilat   Heart: RRR   Abdomen:  soft, nontender  Extremities:  Lower extremities - + 2 pretibial edema.   Neuro: MS is good  with appropriate affect, pt is alert and Ox3    DATA REVIEWED:     Labs through PCPs office 06/16/2024  Total cholesterol 291 HDL 28 Triglyceride 1263 BUN 21 Creatinine 1 GFR 84 A1c 7.1 TSH 1.87   Old records , labs and images have been reviewed.    ASSESSMENT / PLAN / RECOMMENDATIONS:   1) Type 2 Diabetes Mellitus, acceptable control- Most recent A1c of 7.1 %. Goal A1c < 7.0 %.    -We discussed the importance of low-carb diet, the patient does  admit to dietary indiscretions he also admits that he has not following recommendations of PCP - He has been out of test trips, a prescription for Accu-Chek meter and extra strips were sent to the pharmacy - He has been taking NPH twice daily.  When asked how much regular insulin he takes, the patient states it depends on his sugar when he is going to eat, but he has not checked his glucose at home in a while so I am not sure how he has been estimating his insulin intake, patient finally did say that he will take 40 units with a meal, he does take his insulin after the meal because if he takes it before the meal he would notice hypoglycemia - We discussed the pharmacokinetics of basal/prandial insulin.  I did advise the patient that regular insulin should be taken 20-30 minutes before the meal and not after.  If he has hypoglycemia is because the dose is too high for him - I will switch him to insulin analogs as below - He was also prescribed Dexcom, and was trained by my CMA on proper Dexcom use  MEDICATIONS: Stop NPH Stop regular insulin Take Toujeo 100 units once daily Take Humalog 30 units with each meal Start CF: Humalog(BG -120/20)  EDUCATION / INSTRUCTIONS: BG monitoring instructions: Patient is instructed to check his blood sugars 3 times a day, before meals. Call [facility] if: BG persistently < 70  I reviewed the Rule of 15 for the treatment of hypoglycemia in detail with the patient. Literature  supplied.    3) Dyslipidemia:  - Patient with long history of hypertriglyceridemia.  As high as 2000 mg/DL - Most likely there is a hereditary component to this but also dietary causes - We discussed the importance of low-fat diet, based on the wife's conversation it appears that this may be difficult as the family is together with wife's brother and other family members and they do not eat a low-fat diet, and the wife believes that patient will not be able to resist - A referral to our CDE has been placed to discuss low-fat low-carb diet - I have recommended restarting him on statin therapy, discussed the cardiovascular benefit of statin therapy - Patient will continue fenofibrate, but I will increase the dose - Discussed the risk of pancreatitis with high triglycerides level - Patient takes lipid-lowering medication in the morning, as he is unable to take it at night or he would forget  Medication Start rosuvastatin 20 mg daily Increase fenofibrate to 200 mg daily   Follow-up in 3 months  I spent 60 minutes preparing to see the patient by review of recent labs, imaging and procedures, obtaining and reviewing separately obtained history, communicating with the patient/family or caregiver, ordering medications, tests or procedures, and documenting clinical information in the EHR including the differential Dx, treatment, and any further evaluation and other management    Signed electronically by: Stefano Redgie Butts, MD  [facility] Endocrinology  [facility] [address] [place] [place], [place] [address] Phone: [phone] FAX: [phone]   CC: [name] Ingle, MD [address] [place] [address] Phone: [phone]  Fax: [phone]    Return to Endocrinology clinic as below: Future Appointments  Date Time Provider Department Center  08/19/2024 12:00 PM [facility]  08/24/2024  1:30 PM [facility]ECHO/VAS [facility] [address]   08/27/2024  4:00 PM [name] Slain, MD [facility] [address]  09/06/2024  2:00 PM Knox, Leita CROME,  RD [facility]  11/10/2024  1:40 PM Giannie Soliday, Donell Cardinal, MD [facility] None

## 2024-08-12 ENCOUNTER — Other Ambulatory Visit: Payer: Self-pay

## 2024-08-12 MED ORDER — ACCU-CHEK GUIDE W/DEVICE KIT: 1.0000 | PACK | Freq: Three times a day (TID) | 0 refills | Status: AC

## 2024-08-12 MED ORDER — ACCU-CHEK FASTCLIX LANCETS MISC: 1 refills | Status: AC

## 2024-08-12 MED ORDER — ACCU-CHEK GUIDE TEST VI STRP: 1.0000 | ORAL_STRIP | Freq: Three times a day (TID) | 12 refills | Status: AC

## 2024-08-17 ENCOUNTER — Encounter (HOSPITAL_COMMUNITY): Payer: Self-pay

## 2024-08-19 ENCOUNTER — Ambulatory Visit
Admission: RE | Admit: 2024-08-19 | Discharge: 2024-08-19 | Disposition: A | Discharge status: Home / Self Care | Source: Ambulatory Visit | Attending: Cardiology | Admitting: Cardiology

## 2024-08-19 DIAGNOSIS — R931 Abnormal findings on diagnostic imaging of heart and coronary circulation: Secondary | ICD-10-CM | POA: Diagnosis not present

## 2024-08-19 DIAGNOSIS — I447 Left bundle-branch block, unspecified: Secondary | ICD-10-CM | POA: Insufficient documentation

## 2024-08-19 DIAGNOSIS — K760 Fatty (change of) liver, not elsewhere classified: Secondary | ICD-10-CM | POA: Insufficient documentation

## 2024-08-19 LAB — NM PET CT CARDIAC PERFUSION MULTI W/ABSOLUTE BLOODFLOW
LV dias vol: 168 mL (ref 62–150)
MBFR: 1.64
Nuc Rest EF: 44 %
Nuc Stress EF: 50 %
Peak HR: 71 {beats}/min
Rest HR: 81 {beats}/min
Rest MBF: 0.61 ml/g/min
Rest Nuclear Isotope Dose: 24.9 mCi
SRS: 1
SSS: 10
ST Depression (mm): 0 mm
Stress MBF: 1 ml/g/min
Stress Nuclear Isotope Dose: 25.2 mCi
TID: 1.2

## 2024-08-19 MED ORDER — REGADENOSON 0.4 MG/5ML IV SOLN
INTRAVENOUS | Status: AC
  Filled 2024-08-19: qty 5

## 2024-08-19 MED ORDER — REGADENOSON 0.4 MG/5ML IV SOLN
0.4000 mg | Freq: Once | INTRAVENOUS | Status: AC
  Administered 2024-08-19: 0.4 mg via INTRAVENOUS
  Filled 2024-08-19: qty 5

## 2024-08-19 MED ORDER — RUBIDIUM RB82 GENERATOR (RUBYFILL)
25.0000 | PACK | Freq: Once | INTRAVENOUS | Status: AC
  Administered 2024-08-19: 24.94 via INTRAVENOUS

## 2024-08-19 MED ORDER — RUBIDIUM RB82 GENERATOR (RUBYFILL)
25.0000 | PACK | Freq: Once | INTRAVENOUS | Status: AC
  Administered 2024-08-19: 25.19 via INTRAVENOUS

## 2024-08-24 ENCOUNTER — Other Ambulatory Visit (INDEPENDENT_AMBULATORY_CARE_PROVIDER_SITE_OTHER)

## 2024-08-24 DIAGNOSIS — I447 Left bundle-branch block, unspecified: Secondary | ICD-10-CM

## 2024-08-24 LAB — ECHOCARDIOGRAM COMPLETE
Area-P 1/2: 3.42 cm2
S' Lateral: 3.88 cm

## 2024-08-24 MED ORDER — PERFLUTREN LIPID MICROSPHERE
1.0000 mL | INTRAVENOUS | Status: AC | PRN
  Administered 2024-08-24: 1 mL via INTRAVENOUS

## 2024-08-25 ENCOUNTER — Encounter (HOSPITAL_BASED_OUTPATIENT_CLINIC_OR_DEPARTMENT_OTHER): Payer: Self-pay

## 2024-08-27 ENCOUNTER — Encounter (HOSPITAL_BASED_OUTPATIENT_CLINIC_OR_DEPARTMENT_OTHER): Payer: Self-pay | Admitting: Cardiology

## 2024-08-27 ENCOUNTER — Ambulatory Visit (HOSPITAL_BASED_OUTPATIENT_CLINIC_OR_DEPARTMENT_OTHER): Admitting: Cardiology

## 2024-08-27 ENCOUNTER — Ambulatory Visit (HOSPITAL_BASED_OUTPATIENT_CLINIC_OR_DEPARTMENT_OTHER): Payer: Self-pay | Admitting: Cardiology

## 2024-08-27 VITALS — BP 154/62 | HR 53 | Ht 72.0 in | Wt 289.3 lb

## 2024-08-27 DIAGNOSIS — Z712 Person consulting for explanation of examination or test findings: Secondary | ICD-10-CM | POA: Diagnosis not present

## 2024-08-27 DIAGNOSIS — I10 Essential (primary) hypertension: Secondary | ICD-10-CM

## 2024-08-27 DIAGNOSIS — Z794 Long term (current) use of insulin: Secondary | ICD-10-CM

## 2024-08-27 DIAGNOSIS — Z7189 Other specified counseling: Secondary | ICD-10-CM

## 2024-08-27 DIAGNOSIS — E782 Mixed hyperlipidemia: Secondary | ICD-10-CM

## 2024-08-27 DIAGNOSIS — Z8249 Family history of ischemic heart disease and other diseases of the circulatory system: Secondary | ICD-10-CM

## 2024-08-27 DIAGNOSIS — E1159 Type 2 diabetes mellitus with other circulatory complications: Secondary | ICD-10-CM

## 2024-08-27 DIAGNOSIS — I25118 Atherosclerotic heart disease of native coronary artery with other forms of angina pectoris: Secondary | ICD-10-CM

## 2024-08-27 DIAGNOSIS — I447 Left bundle-branch block, unspecified: Secondary | ICD-10-CM

## 2024-08-27 DIAGNOSIS — R9439 Abnormal result of other cardiovascular function study: Secondary | ICD-10-CM | POA: Diagnosis not present

## 2024-08-27 DIAGNOSIS — E8881 Metabolic syndrome: Secondary | ICD-10-CM

## 2024-08-27 DIAGNOSIS — E119 Type 2 diabetes mellitus without complications: Secondary | ICD-10-CM | POA: Insufficient documentation

## 2024-08-27 MED ORDER — ISOSORBIDE MONONITRATE ER 30 MG PO TB24: 30.0000 mg | ORAL_TABLET | Freq: Every day | ORAL | 3 refills | Status: AC

## 2024-08-27 MED ORDER — NITROGLYCERIN 0.4 MG SL SUBL: 0.4000 mg | SUBLINGUAL_TABLET | SUBLINGUAL | 3 refills | Status: AC | PRN

## 2024-08-27 NOTE — Progress Notes (Signed)
 Cardiology Office Note:  .   Date:  08/27/2024  ID:  Ronald Bauer, DOB Nov 05, 1960, MRN [id number] PCP: [name] Ingle, MD  [facility] Providers Cardiologist:  Shelda Bruckner, MD {  History of Present Illness: .   Ronald Bauer is a 63 y.o. male with PMH hypertension, type II diabetes, dyslipidemia, obesity who is seen for follow up. I met him 07/27/24 as a new patient for LBBB at the request of Dr. [name].  CV history: Referral from 06/24/24 reviewed. Referral packet from Dr [name] showed ECG from 06/16/23 with SR, QRS 84 ms. ECG from 06-15-24 shows SR with new LBBB and QRS of 161 ms. No symptoms noted. Tchol 291, HDL 28, TG 1262, LDL unable to be calculated.  Has had diabetes since his mid 30s (weighed <150 lbs when he was diagnosed). Has been on insulin for about 30 years (shortly after diagnosis), last A1c 7.1 06/2024. Hypertension has been a more recent issue, only in the last few years. Former tobacco, quit ~30 years ago.   FH: father had hypertension, diabetes, high cholesterol, MI. Diabetes runs throughout family, most of them have passed 2/2 complications from diabetes.  Today: Has been struggling with getting sugars under control with changes in his diabetes regimen. Can only feel it when his sugars are very high.   We reviewed his cardiac PET and his echo at length. Had some chest wall soreness after echo from where they had pushed, but resolved after a day.   Does still have some shortness of breath with walking. Always feels tired. Very limited activity. Discussed medical management, cath, what stents or CABG would do at length, see below.   ROS:  No PND, orthopnea, worsening LE edema or unexpected weight gain. No syncope or palpitations. ROS otherwise negative except as noted.   Studies Reviewed: SABRA    EKG:       Physical Exam:   VS:  BP (!) 154/62 (Cuff Size: Large)   Pulse (!) 53   Ht 6' (1.829 m)   Wt 289 lb 4.8 oz (131.2 kg)   SpO2 97%   BMI 39.24  kg/m    Wt Readings from Last 3 Encounters:  08/27/24 289 lb 4.8 oz (131.2 kg)  08/04/24 285 lb 6.4 oz (129.5 kg)  07/27/24 288 lb (130.6 kg)    GEN: Well nourished, well developed in no acute distress HEENT: Normal, moist mucous membranes NECK: No JVD CARDIAC: regular rhythm, normal S1 and S2, no rubs or gallops. No murmur. VASCULAR: Radial and DP pulses 2+ bilaterally. No carotid bruits RESPIRATORY:  Clear to auscultation without rales, wheezing or rhonchi  ABDOMEN: Soft, non-tender, non-distended MUSCULOSKELETAL:  Ambulates independently SKIN: Warm and dry, no edema NEUROLOGIC:  Alert and oriented x 3. No focal neuro deficits noted. PSYCHIATRIC:  Normal affect    ASSESSMENT AND PLAN: .    Abnormal stress test, consistent with CAD Stable angina LBBB Type II diabetes, on insulin for ~30 years Dyslipidemia, with severely elevated TG FH of heart disease Metabolic syndrome -no chest pain, but minimally active and has longstanding diabetes, so may have atypical symptoms -echo with normal LVEF but grade 2 diastolic dysfunction -cardiac PET with evidence of ischemia in inferior wall, reduced myocardial blood flow -severely elevated TG. Now on higher dose of fenofibrate and statin. If no significant improvement, referral to our lipid clinic for possible genetic evaluation or olezarsen.  -has metabolic syndrome with abdominal adiposity, BMI 39, type II diabetes, dyslipidemia, hypertension. I would  typically like GLP1 in this case, but with severely elevated TG, my concern is risk of pancreatitis (though he has not had previously). The counterpoint is that GLP may improve TG with metabolic changes/weight loss.  -sees dietician next week -discussed recommendations for gradually improving activity -anti anginals: on max dose amlodipine. On metoprolol succinate 50 mg daily, HR 53, no room to increase. Discussed imdur today -if he is on three antianginals and still feels very limited with  exertion, would then cath. We discussed indications for stents. Also discussed that there is a likelihood that longstanding diabetes may have lead to severe multivessel disease -previous remote tobacco and heavy alcohol use, stopped with diabetes diagnosis decades ago -discussed aspirin, he is already taking this -discussed SLNG, how to use. He is not on PDE5i but discussed the interaction with this just in case -reviewed red flag warning signs that need immediate medical attention  Hypertension -well controlled on amlodipine 10 mg daily, lisinopril-hydrochlorothiazide 20-25 mg daily, metoprolol succinate 50 mg daily -discussed imdur as above -has been watching his salt, trying to cut back  CV risk counseling and prevention -recommend heart healthy/Mediterranean diet, with whole grains, fruits, vegetable, fish, lean meats, nuts, and olive oil. Limit salt. -recommend moderate walking, 3-5 times/week for 30-50 minutes each session. Aim for at least 150 minutes/week. Goal should be pace of 3 miles/hours, or walking 1.5 miles in 30 minutes -recommend avoidance of tobacco products. Avoid excess alcohol.  Dispo: 1 month  Total time of encounter: I spent 48 minutes dedicated to the care of this patient on the date of this encounter to include pre-visit review of records, face-to-face time with the patient discussing conditions above, and clinical documentation with the electronic health record. We specifically spent time today discussing pet results, recommendations for management, discussion of lipids, recommendations on diet and activity and overall risk management   Signed, Shelda Bruckner, MD   Shelda Bruckner, MD, PhD, [facility] [place] [place]  [facility]  [place]  [facility] at [facility] at [facility] [address] [place], [place] [address] [phone]

## 2024-08-27 NOTE — Patient Instructions (Addendum)
 Start by taking 1/2 pill (15 mg) of isosorbide once a day for 4 days. If you do ok on this, increase to a full pill (30 mg) once a day. Watch for headaches, low blood pressures. Call me with issues.   Lab Work: none If you have labs (blood work) drawn today and your tests are completely normal, you will receive your results only by: MyChart Message (if you have MyChart) OR A paper copy in the mail If you have any lab test that is abnormal or we need to change your treatment, we will call you to review the results.  Testing/Procedures: none  Follow-Up: At [facility], you and your health needs are our priority.  As part of our continuing mission to provide you with exceptional heart care, our providers are all part of one team.  This team includes your primary Cardiologist (physician) and Advanced Practice Providers or APPs (Physician Assistants and Nurse Practitioners) who all work together to provide you with the care you need, when you need it.  Your next appointment:   November 20 , 2025 at 11:40  Provider:   Shelda Bruckner, MD    We recommend signing up for the patient portal called MyChart.  Sign up information is provided on this After Visit Summary.  MyChart is used to connect with patients for Virtual Visits (Telemedicine).  Patients are able to view lab/test results, encounter notes, upcoming appointments, etc.  Non-urgent messages can be sent to your provider as well.   To learn more about what you can do with MyChart, go to [email or web address].   Other Instructions

## 2024-09-06 ENCOUNTER — Encounter: Attending: Internal Medicine | Admitting: Dietician

## 2024-09-06 ENCOUNTER — Encounter: Payer: Self-pay | Admitting: Dietician

## 2024-09-06 VITALS — Ht 72.0 in | Wt 288.0 lb

## 2024-09-06 DIAGNOSIS — Z794 Long term (current) use of insulin: Secondary | ICD-10-CM | POA: Diagnosis present

## 2024-09-06 DIAGNOSIS — E1165 Type 2 diabetes mellitus with hyperglycemia: Secondary | ICD-10-CM | POA: Diagnosis present

## 2024-09-06 NOTE — Progress Notes (Signed)
 Diabetes Self-Management Education  Visit Type: First/Initial  Appt. Start Time: 1035 Appt. End Time: 1215  09/06/2024  [name], identified by name and date of birth, is a 63 y.o. male with a diagnosis of Diabetes: Type 2.   ASSESSMENT Patient is here today with his wife.   He was having increased problems with hypoglycemia but now has the Dexcom and this has helped with earlier treatment as well as his change in insulin. He doesn't always bring his meal time insulin when out to eat. His wife states that he eats seconds. Increased snacking - bored eating, night eating Both find eating changes difficult. Rotates his insulin sites well. Takes the Humalog at home then takes his son to eat and goes for breakfast - advised patient of dangers of this.   Referral:  Type 2 Diabetes.  Dr. Sam  History includes:  Type 2 Diabetes (1994) HTN, hypothyroidism Medications include:  Toujeo 100 units q am, Humalog 30 + sliding scale (BG - 120/20) before meals, MVI Labs noted to include:  A1C 7.1% 06/16/2024, eGFR 84  CGM: Dexcom G7  CGM Results from download: 09/06/2024  % Time CGM active:   94 %   (Goal >70%)  Average glucose:   188 mg/dL for 14 days  Glucose management indicator:   7.8 %  Time in range (70-180 mg/dL):   55 %   (Goal >29%)  Time High (181-250 mg/dL):   20 %   (Goal < 74%)  Time Very High (>250 mg/dL):    24 %   (Goal < 5%)  Time Low (54-69 mg/dL):   1 %   (Goal <5%)  Time Very Low (<54 mg/dL):   <1 %   (Goal <8%)  %CV (glucose variability)     %  (Goal <36%)   72 288 lbs 09/06/2024 160 lbs 1994 (when diagnosed with diabetes) UBW 280 lbs   Patient lives with his wife and brother-in-law.  Patient and his brother-in-law do the shopping and cooking. 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 Would like to go to the [facility] but doesn't know if his insurance covers this and they are not sure when to find time. Bowls once per week. Hips  hurt when he walks. Enjoys swimming. Height 6' (1.829 m), weight 288 lb (130.6 kg). Body mass index is 39.06 kg/m.   Diabetes Self-Management Education - 09/06/24 1102       Visit Information   Visit Type First/Initial      Initial Visit   Diabetes Type Type 2    Date Diagnosed 1994    Are you currently following a meal plan? No    Are you taking your medications as prescribed? No      Health Coping   How would you rate your overall health? Good      Psychosocial Assessment   Patient Belief/Attitude about Diabetes Other (comment)   Don't like it   What is the hardest part about your diabetes right now, causing you the most concern, or is the most worrisome to you about your diabetes?   Making healty food and beverage choices;Being active    Self-care barriers None    Self-management support Doctor's office    Other persons present Patient;Spouse/SO    Patient Concerns Nutrition/Meal planning;Glycemic Control;Healthy Lifestyle;Problem Solving;Weight Control;Support    Special Needs None    Preferred Learning Style No preference indicated    Learning Readiness Ready    How often do  you need to have someone help you when you read instructions, pamphlets, or other written materials from your doctor or pharmacy? 1 - Never    What is the last grade level you completed in school? 9      Pre-Education Assessment   Patient understands the diabetes disease and treatment process. Needs Review    Patient understands incorporating nutritional management into lifestyle. Needs Review    Patient undertands incorporating physical activity into lifestyle. Needs Review    Patient understands using medications safely. Needs Review    Patient understands monitoring blood glucose, interpreting and using results Needs Review    Patient understands prevention, detection, and treatment of acute complications. Needs Review    Patient understands prevention, detection, and treatment of chronic  complications. Needs Review    Patient understands how to develop strategies to address psychosocial issues. Needs Review    Patient understands how to develop strategies to promote health/change behavior. Needs Review      Complications   Last HgB A1C per patient/outside source 7.1 %   06/16/2024   How often do you check your blood sugar? > 4 times/day    Fasting Blood glucose range (mg/dL) 29-870;869-820;819-799;>799    Postprandial Blood glucose range (mg/dL) >799;819-799;869-820    Number of hypoglycemic episodes per month 1    Can you tell when your blood sugar is low? Yes    What do you do if your blood sugar is low? eats something    Number of hyperglycemic episodes ( >200mg /dL): Daily    Can you tell when your blood sugar is high? Yes    Have you had a dilated eye exam in the past 12 months? Yes    Have you had a dental exam in the past 12 months? No    Are you checking your feet? Yes    How many days per week are you checking your feet? 1      Dietary Intake   Breakfast salad with cheese, italian dressing OR sausage and eggs, bun OR chicken stirfry and rice    Snack (morning) none    Lunch hamburger steak, hot dog and bun, roasted potatoes OR chicken, black-eyed peas, corn, mashed potatoes with gravy    Snack (afternoon) none    Dinner hamburger steak, roasted potatoes    Snack (evening) none    Beverage(s) very little water, 6 cans diet Mt. Dew, occasional sweet tea, coffee with splenda and creamer      Activity / Exercise   Activity / Exercise Type Light (walking / raking leaves)    How many days per week do you exercise? 1    How many minutes per day do you exercise? 60    Total minutes per week of exercise 60      Patient Education   Previous Diabetes Education Yes    Disease Pathophysiology Explored patient's options for treatment of their diabetes;Definition of diabetes, type 1 and 2, and the diagnosis of diabetes    Healthy Eating Plate Method;Meal options for  control of blood glucose level and chronic complications.    Being Active Role of exercise on diabetes management, blood pressure control and cardiac health.;Helped patient identify appropriate exercises in relation to his/her diabetes, diabetes complications and other health issue.    Medications Reviewed patients medication for diabetes, action, purpose, timing of dose and side effects.    Monitoring Taught/evaluated CGM (comment);Identified appropriate SMBG and/or A1C goals.;Daily foot exams;Yearly dilated eye exam    Acute complications  Taught prevention, symptoms, and  treatment of hypoglycemia - the 15 rule.    Chronic complications Relationship between chronic complications and blood glucose control    Diabetes Stress and Support Identified and addressed patients feelings and concerns about diabetes;Worked with patient to identify barriers to care and solutions;Role of stress on diabetes      Individualized Goals (developed by patient)   Nutrition General guidelines for healthy choices and portions discussed    Physical Activity Exercise 3-5 times per week;30 minutes per day    Medications take my medication as prescribed    Monitoring  Consistenly use CGM    Problem Solving Eating Pattern;Addressing barriers to behavior change    Reducing Risk examine blood glucose patterns;do foot checks daily;treat hypoglycemia with 15 grams of carbs if blood glucose less than 70mg /dL      Post-Education Assessment   Patient understands the diabetes disease and treatment process. Comprehends key points    Patient understands incorporating nutritional management into lifestyle. Needs Review    Patient undertands incorporating physical activity into lifestyle. Needs Review    Patient understands using medications safely. Comphrehends key points    Patient understands monitoring blood glucose, interpreting and using results Comprehends key points    Patient understands prevention, detection, and  treatment of acute complications. Needs Review    Patient understands prevention, detection, and treatment of chronic complications. Comprehends key points    Patient understands how to develop strategies to address psychosocial issues. Comprehends key points    Patient understands how to develop strategies to promote health/change behavior. Needs Review      Outcomes   Expected Outcomes Demonstrated interest in learning but significant barriers to change    Future DMSE PRN;2 months    Program Status Not Completed          Individualized Plan for Diabetes Self-Management Training:   Learning Objective:  Patient will have a greater understanding of diabetes self-management. Patient education plan is to attend individual and/or group sessions per assessed needs and concerns.   Plan:   Patient Instructions  Consider finding a friend to join you at the pool.   Drink more water. Bake rather than fry most often. Half your plate non-starchy vegetables. Limit second servings. Avoid snacking - especially if not hungry.    Take your Humalog with you to the restaurant and take 15 minutes before you eat.  Avoid taking at home and driving. Keep something in your car to treat low blood glucose.   Expected Outcomes:  Demonstrated interest in learning but significant barriers to change  Education material provided: ADA - How to Thrive: A Guide for Your Journey with Diabetes, Food label handouts, Meal plan card, Snack sheet, and Diabetes Resources, hypoglycemia  If problems or questions, patient to contact team via:  Phone  Future DSME appointment: PRN, 2 months

## 2024-09-06 NOTE — Patient Instructions (Addendum)
 Consider finding a friend to join you at the pool.   Drink more water. Bake rather than fry most often. Half your plate non-starchy vegetables. Limit second servings. Avoid snacking - especially if not hungry.    Take your Humalog with you to the restaurant and take 15 minutes before you eat.  Avoid taking at home and driving. Keep something in your car to treat low blood glucose.

## 2024-09-23 ENCOUNTER — Ambulatory Visit (HOSPITAL_BASED_OUTPATIENT_CLINIC_OR_DEPARTMENT_OTHER): Admitting: Cardiology

## 2024-09-23 ENCOUNTER — Encounter (HOSPITAL_BASED_OUTPATIENT_CLINIC_OR_DEPARTMENT_OTHER): Payer: Self-pay | Admitting: Cardiology

## 2024-09-23 VITALS — BP 110/60 | HR 55 | Ht 72.0 in | Wt 288.9 lb

## 2024-09-23 DIAGNOSIS — I447 Left bundle-branch block, unspecified: Secondary | ICD-10-CM

## 2024-09-23 DIAGNOSIS — R9439 Abnormal result of other cardiovascular function study: Secondary | ICD-10-CM | POA: Diagnosis not present

## 2024-09-23 DIAGNOSIS — E8881 Metabolic syndrome: Secondary | ICD-10-CM

## 2024-09-23 DIAGNOSIS — E1159 Type 2 diabetes mellitus with other circulatory complications: Secondary | ICD-10-CM | POA: Diagnosis not present

## 2024-09-23 DIAGNOSIS — E782 Mixed hyperlipidemia: Secondary | ICD-10-CM

## 2024-09-23 DIAGNOSIS — Z794 Long term (current) use of insulin: Secondary | ICD-10-CM

## 2024-09-23 DIAGNOSIS — I25118 Atherosclerotic heart disease of native coronary artery with other forms of angina pectoris: Secondary | ICD-10-CM | POA: Diagnosis not present

## 2024-09-23 NOTE — Progress Notes (Signed)
 Cardiology Office Note:  .   Date:  09/23/2024  ID:  Ronald Bauer, DOB 10-08-1961, MRN [id number] PCP: [name] Ingle, MD  [name] Providers Cardiologist:  Shelda Bruckner, MD {  History of Present Illness: .   Ronald Bauer is a 63 y.o. male with PMH abnormal stress test, hypertension, type II diabetes, dyslipidemia, obesity who is seen for follow up. I met him 07/27/24 as a new patient for LBBB at the request of Dr. [name].  CV history: Referral from 06/24/24 reviewed. Referral packet from Dr [name] showed ECG from 06/16/23 with SR, QRS 84 ms. ECG from 06-15-24 shows SR with new LBBB and QRS of 161 ms. No symptoms noted. Tchol 291, HDL 28, TG 1262, LDL unable to be calculated.  Has had diabetes since his mid 30s (weighed <150 lbs when he was diagnosed). Has been on insulin  for about 30 years (shortly after diagnosis), last A1c 7.1 06/2024. Hypertension has been a more recent issue, only in the last few years. Former tobacco, quit ~30 years ago.   FH: father had hypertension, diabetes, high cholesterol, MI. Diabetes runs throughout family, most of them have passed 2/2 complications from diabetes.  Cardiac PET 08/2024 with evidence of ischemia in inferior wall, reduced myocardial blood flow. Echo 08/2024 with EF 55-60%, G2DD. Read as global hypokinesis despite normal EF. On my review, EF appears ~50% on noncontrast images but ~60% on contrast images, wall motion difficult to see even with contrast. There does appear to be dyssynchrony from LBBB.  Today: Seen for close follow up given abnormal cardiac PET and start of imdur  (was already on amlodipine and metoprolol). Taking full pill of imdur , no headaches, no lightheadeness. Still feels shortwinded with bowling, other exertion, but this is brief and self limited. Has never had chest pain as his anginal equivalent. Hasn't required nitroglycerin . Is minimally active at baseline. We discussed cardiac rehab, he is interested, referral  sent for stable angina today. Reviewed his cholesterol, plan for follow up, see below.  ROS:  No PND, orthopnea, worsening LE edema or unexpected weight gain. No syncope or palpitations. ROS otherwise negative except as noted.   Studies Reviewed: SABRA    EKG:       Physical Exam:   VS:  BP 110/60 (BP Location: Right Arm, Patient Position: Sitting, Cuff Size: Large)   Pulse (!) 55   Ht 6' (1.829 m)   Wt 288 lb 14.4 oz (131 kg)   SpO2 97%   BMI 39.18 kg/m    Wt Readings from Last 3 Encounters:  09/23/24 288 lb 14.4 oz (131 kg)  09/06/24 288 lb (130.6 kg)  08/27/24 289 lb 4.8 oz (131.2 kg)    GEN: Well nourished, well developed in no acute distress HEENT: Normal, moist mucous membranes NECK: No JVD CARDIAC: regular rhythm, normal S1 and S2, no rubs or gallops. No murmur. VASCULAR: Radial and DP pulses 2+ bilaterally. No carotid bruits RESPIRATORY:  Clear to auscultation without rales, wheezing or rhonchi  ABDOMEN: Soft, non-tender, non-distended MUSCULOSKELETAL:  Ambulates independently SKIN: Warm and dry, no edema NEUROLOGIC:  Alert and oriented x 3. No focal neuro deficits noted. PSYCHIATRIC:  Normal affect    ASSESSMENT AND PLAN: .    Abnormal stress test, consistent with CAD Stable angina LBBB Type II diabetes, on insulin  for ~30 years Dyslipidemia, with severely elevated TG FH of heart disease Metabolic syndrome -no chest pain, dyspnea is anginal equivalent, minimally active -echo with normal LVEF but grade 2  diastolic dysfunction; on personal review, EF appears ~50% on noncontrast images but ~60% on contrast images, there is dyssynchrony from LBBB, difficult to assess wall motion even with contrast -cardiac PET with evidence of ischemia in inferior wall, reduced myocardial blood flow -severely elevated TG (1262). Did not have direct LDL at that time. Now on higher dose of fenofibrate and statin, changed about 6 weeks ago. If no significant improvement, referral to lipid  clinic for possible genetic evaluation or olezarsen. This is followed by his endocrinologist, he has follow up in about 6 weeks. Will order labs for January, will need fasting lipids and a direct LDL -has metabolic syndrome with abdominal adiposity, BMI 39, type II diabetes, dyslipidemia, hypertension. I would typically like GLP1 in this case, but with severely elevated TG, my concern is risk of pancreatitis (though he has not had previously). The counterpoint is that GLP may improve TG with metabolic changes/weight loss. Defer to his endocrinologist on this -anti anginals: on max dose amlodipine. On metoprolol succinate 50 mg daily, HR 55, no room to increase. Tolerating low dose imdur, but BP does not allow for room for uptitration -we discussed cardiac rehab both to increase his stamina/improve angina, and we also discussed that if he is still very limited in cardiac rehab despite three antianginals, then I would pursue cath.  -We have discussed cath. We discussed indications for stents. Also discussed that there is a likelihood that longstanding diabetes may have lead to severe multivessel disease -previous remote tobacco and heavy alcohol use, stopped with diabetes diagnosis decades ago -continue aspirin -discussed SLNG, how to use. He is not on PDE5i -discussed lipoprotein a, will check as well -reviewed red flag warning signs that need immediate medical attention  Hypertension -well controlled on amlodipine 10 mg daily, lisinopril-hydrochlorothiazide 20-25 mg daily, metoprolol succinate 50 mg daily -now on imdur 30 mg daily -has been watching his salt, trying to cut back  CV risk counseling and prevention -recommend heart healthy/Mediterranean diet, with whole grains, fruits, vegetable, fish, lean meats, nuts, and olive oil. Limit salt. -recommend moderate walking, 3-5 times/week for 30-50 minutes each session. Aim for at least 150 minutes/week. Goal should be pace of 3 miles/hours, or  walking 1.5 miles in 30 minutes -recommend avoidance of tobacco products. Avoid excess alcohol.  Dispo: 2-3 months, ideally once in cardiac rehab. Lipids first week of January prior to endocrinology visit  Total time of encounter: I spent 43 minutes dedicated to the care of this patient on the date of this encounter to include pre-visit review of records, face-to-face time with the patient discussing conditions above, and clinical documentation with the electronic health record. We specifically spent time today discussing symptoms, what to watch for, options for management including cardiac rehab, cholesterol and goals. Reviewed signs that would move us  to cath and red flags that need immediate medical attention   Signed, Shelda Bruckner, MD   Shelda Bruckner, MD, PhD, [facility] [place]  [facility]  [place]  [facility] V[facility] at [facility] [address], [address] [place], [place] [address] [phone]

## 2024-09-23 NOTE — Patient Instructions (Signed)
 Medication Instructions:  Continue your current medications *If you need a refill on your cardiac medications before your next appointment, please call your pharmacy*  Lab Work: Your physician recommends that you return for lab work prior to your January endocrinology appointment.    If you have labs (blood work) drawn today and your tests are completely normal, you will receive your results only by: MyChart Message (if you have MyChart) OR A paper copy in the mail If you have any lab test that is abnormal or we need to change your treatment, we will call you to review the results.  Follow-Up: At [facility], you and your health needs are our priority.  As part of our continuing mission to provide you with exceptional heart care, our providers are all part of one team.  This team includes your primary Cardiologist (physician) and Advanced Practice Providers or APPs (Physician Assistants and Nurse Practitioners) who all work together to provide you with the care you need, when you need it.  Your next appointment:   2-3 month(s)  Provider:   Shelda Bruckner, MD    We recommend signing up for the patient portal called MyChart.  Sign up information is provided on this After Visit Summary.  MyChart is used to connect with patients for Virtual Visits (Telemedicine).  Patients are able to view lab/test results, encounter notes, upcoming appointments, etc.  Non-urgent messages can be sent to your provider as well.   To learn more about what you can do with MyChart, go to [email or web address].   Other Instructions  You have been referred to cardiac rehab, they will be in touch!

## 2024-09-28 ENCOUNTER — Telehealth (HOSPITAL_COMMUNITY): Payer: Self-pay

## 2024-09-28 NOTE — Telephone Encounter (Signed)
 Pt insurance is active and benefits verified through [facility] Medicare Co-pay 0, DED 0/0 met, out of pocket $3,900/$742.48 met, co-insurance 0%. no pre-authorization required. Passport, 09/28/2024@3 :52, REF# [id number]  TCR/ICR? ICR Visit(date of service)limitation? No limit Can multiple codes be used on the same date of service/visit?(IF ITS A LIMIT) n/a  Is this a lifetime maximum or an annual maximum? annual Has the member used any of these services to date? no Is there a time limit (weeks/months) on start of program and/or program completion? no  Will contact patient to see if he is interested in the Cardiac Rehab Program. If interested, patient will need to complete follow up appt. Once completed, patient will be contacted for scheduling upon review by the RN Navigator.

## 2024-09-28 NOTE — Telephone Encounter (Signed)
 Called patient to see if he is interested in the Cardiac Rehab Program. Patient expressed interest. Explained scheduling process and went over insurance process, patient verbalized understanding. Will contact patient for scheduling once he has been reviewed.

## 2024-10-03 ENCOUNTER — Encounter (HOSPITAL_BASED_OUTPATIENT_CLINIC_OR_DEPARTMENT_OTHER): Payer: Self-pay | Admitting: Cardiology

## 2024-10-04 ENCOUNTER — Telehealth (HOSPITAL_COMMUNITY): Payer: Self-pay

## 2024-10-04 NOTE — Telephone Encounter (Signed)
 Called patient to see if he was interested in participating in the Cardiac Rehab Program. Patient will come in for orientation on 12/23 and will attend the 12:30 exercise class.  Sent MyChart message.

## 2024-10-21 ENCOUNTER — Telehealth (HOSPITAL_COMMUNITY): Payer: Self-pay

## 2024-10-21 NOTE — Telephone Encounter (Signed)
 Confirmed cardiac orientation appointment time of 10/26/24 at 1315.  Cardiac health history completed through conversation with his wife.

## 2024-10-26 ENCOUNTER — Encounter (HOSPITAL_COMMUNITY)
Admission: RE | Admit: 2024-10-26 | Discharge: 2024-10-26 | Disposition: A | Discharge status: Home / Self Care | Source: Ambulatory Visit | Attending: Cardiology | Admitting: Cardiology

## 2024-10-26 VITALS — BP 142/50 | HR 63 | Ht 71.0 in | Wt 287.7 lb

## 2024-10-26 DIAGNOSIS — I2089 Other forms of angina pectoris: Secondary | ICD-10-CM | POA: Insufficient documentation

## 2024-10-26 NOTE — Progress Notes (Signed)
 Cardiac Rehab Medication Review by a Nurse   Does the patient feel that his/her medications are working for him/her?  yes   Has the patient been experiencing any side effects to the medications prescribed?  no   Does the patient measure his/her own blood pressure or blood glucose at home?  yes    Does the patient have any problems obtaining medications due to transportation or finances?   no   Understanding of regimen: good Understanding of indications: good Potential of compliance: excellent     Nurse comments: Pt states, I have no questions about my prescribed medications at this time.

## 2024-10-26 NOTE — Progress Notes (Signed)
 Cardiac Individual Treatment Plan  Patient Details  Name: Ronald Bauer MRN: [id number] Date of Birth: 1960/12/26 Referring Provider:   Flowsheet Row INTENSIVE CARDIAC REHAB ORIENT from 10/26/2024 in [facility]  Referring Provider Shelda Bruckner, MD    Initial Encounter Date:  Flowsheet Row INTENSIVE CARDIAC REHAB ORIENT from 10/26/2024 in [facility]  Date 10/26/24    Visit Diagnosis: Stable angina  Patient's Home Medications on Admission: Current Medications[1]  Past Medical History: Past Medical History:  Diagnosis Date   Diabetes mellitus without complication (HCC)    Hypertension    Thyroid disease     Tobacco Use: Tobacco Use History[2]  Labs: Review Flowsheet        No data to display          Capillary Blood Glucose: No results found for: GLUCAP   Exercise Target Goals: Exercise Program Goal: Individual exercise prescription set using results from initial 6 min walk test and THRR while considering  patients activity barriers and safety.   Exercise Prescription Goal: Initial exercise prescription builds to 30-45 minutes a day of aerobic activity, 2-3 days per week.  Home exercise guidelines will be given to patient during program as part of exercise prescription that the participant will acknowledge.  Activity Barriers & Risk Stratification:  Activity Barriers & Cardiac Risk Stratification - 10/26/24 1416       Activity Barriers & Cardiac Risk Stratification   Activity Barriers Arthritis;Deconditioning;Muscular Weakness;Balance Concerns    Cardiac Risk Stratification High          6 Minute Walk:  6 Minute Walk     Row Name 10/26/24 1516         6 Minute Walk   Phase Initial     Distance 850 feet     Walk Time 6 minutes     # of Rest Breaks 1  4:15-5:12 due to chronic rt hip pain     MPH 1.61     METS 1.63      RPE 11     Perceived Dyspnea  0     VO2 Peak 5.7     Symptoms Yes (comment)     Comments Chronic rt hip pain 6/10, chronic left ankle pain 4/10     Resting HR 63 bpm     Resting BP 142/50     Resting Oxygen Saturation  97 %     Exercise Oxygen Saturation  during 6 min walk 96 %     Max Ex. HR 84 bpm     Max Ex. BP 156/68     2 Minute Post BP 140/64        Oxygen Initial Assessment:   Oxygen Re-Evaluation:   Oxygen Discharge (Final Oxygen Re-Evaluation):   Initial Exercise Prescription:  Initial Exercise Prescription - 10/26/24 1400       Date of Initial Exercise RX and Referring Provider   Date 10/26/24    Referring Provider Shelda Bruckner, MD    Expected Discharge Date 01/19/25      NuStep   Level 1    SPM 75    Minutes 15    METs 1.6      Arm Ergometer   Level 1    Watts 25    RPM 60    Minutes 15    METs 1.6      Prescription Details   Frequency (times per  week) 3    Duration Progress to 30 minutes of continuous aerobic without signs/symptoms of physical distress      Intensity   THRR 40-80% of Max Heartrate 62-124    Ratings of Perceived Exertion 11-13    Perceived Dyspnea 0-4      Progression   Progression Continue progressive overload as per policy without signs/symptoms or physical distress.      Resistance Training   Training Prescription Yes    Reps 10-15          Perform Capillary Blood Glucose checks as needed.  Exercise Prescription Changes:   Exercise Comments:   Exercise Goals and Review:   Exercise Goals     Row Name 10/26/24 1414             Exercise Goals   Increase Physical Activity Yes       Intervention Provide advice, education, support and counseling about physical activity/exercise needs.;Develop an individualized exercise prescription for aerobic and resistive training based on initial evaluation findings, risk stratification, comorbidities and participant's personal goals.       Expected Outcomes  Short Term: Attend rehab on a regular basis to increase amount of physical activity.;Long Term: Exercising regularly at least 3-5 days a week.;Long Term: Add in home exercise to make exercise part of routine and to increase amount of physical activity.       Increase Strength and Stamina Yes       Intervention Provide advice, education, support and counseling about physical activity/exercise needs.;Develop an individualized exercise prescription for aerobic and resistive training based on initial evaluation findings, risk stratification, comorbidities and participant's personal goals.       Expected Outcomes Short Term: Increase workloads from initial exercise prescription for resistance, speed, and METs.;Short Term: Perform resistance training exercises routinely during rehab and add in resistance training at home;Long Term: Improve cardiorespiratory fitness, muscular endurance and strength as measured by increased METs and functional capacity ( )       Able to understand and use rate of perceived exertion (RPE) scale Yes       Intervention Provide education and explanation on how to use RPE scale       Expected Outcomes Short Term: Able to use RPE daily in rehab to express subjective intensity level;Long Term:  Able to use RPE to guide intensity level when exercising independently       Knowledge and understanding of Target Heart Rate Range (THRR) Yes       Intervention Provide education and explanation of THRR including how the numbers were predicted and where they are located for reference       Expected Outcomes Short Term: Able to state/look up THRR;Long Term: Able to use THRR to govern intensity when exercising independently;Short Term: Able to use daily as guideline for intensity in rehab       Understanding of Exercise Prescription Yes       Intervention Provide education, explanation, and written materials on patient's individual exercise prescription       Expected Outcomes Short Term: Able  to explain program exercise prescription;Long Term: Able to explain home exercise prescription to exercise independently          Exercise Goals Re-Evaluation :   Discharge Exercise Prescription (Final Exercise Prescription Changes):   Nutrition:  Target Goals: Understanding of nutrition guidelines, daily intake of sodium 1500mg , cholesterol 200mg , calories 30% from fat and 7% or less from saturated fats, daily to have 5 or more servings of fruits and  vegetables.  Biometrics:  Pre Biometrics - 10/26/24 1330       Pre Biometrics   Waist Circumference 55 inches    Hip Circumference 47.5 inches    Waist to Hip Ratio 1.16 %    Triceps Skinfold 11 mm    % Body Fat 37.5 %    Grip Strength 38 kg    Flexibility --   Not performedd back issues   Single Leg Stand 6.8 seconds           Nutrition Therapy Plan and Nutrition Goals:   Nutrition Assessments:  MEDIFICTS Score Key: >=70 Need to make dietary changes  40-70 Heart Healthy Diet <= 40 Therapeutic Level Cholesterol Diet    Picture Your Plate Scores: <59 Unhealthy dietary pattern with much room for improvement. 41-50 Dietary pattern unlikely to meet recommendations for good health and room for improvement. 51-60 More healthful dietary pattern, with some room for improvement.  >60 Healthy dietary pattern, although there may be some specific behaviors that could be improved.    Nutrition Goals Re-Evaluation:   Nutrition Goals Re-Evaluation:   Nutrition Goals Discharge (Final Nutrition Goals Re-Evaluation):   Psychosocial: Target Goals: Acknowledge presence or absence of significant depression and/or stress, maximize coping skills, provide positive support system. Participant is able to verbalize types and ability to use techniques and skills needed for reducing stress and depression.  Initial Review & Psychosocial Screening:  Initial Psych Review & Screening - 10/26/24 1329       Initial Review   Current  issues with Current Sleep Concerns;Current Stress Concerns    Source of Stress Concerns Chronic Illness      Family Dynamics   Good Support System? Yes   Pt has wife, brother-in-law, and two best friends as a part of his support system     Barriers   Psychosocial barriers to participate in program The patient should benefit from training in stress management and relaxation.      Screening Interventions   Interventions Encouraged to exercise;To provide support and resources with identified psychosocial needs;Provide feedback about the scores to participant    Expected Outcomes Long Term Goal: Stressors or current issues are controlled or eliminated.;Short Term goal: Identification and review with participant of any Quality of Life or Depression concerns found by scoring the questionnaire.;Long Term goal: The participant improves quality of Life and PHQ9 Scores as seen by post scores and/or verbalization of changes          Quality of Life Scores:  Quality of Life - 10/26/24 1413       Quality of Life   Select Quality of Life      Quality of Life Scores   Health/Function Pre 20.4 %    Socioeconomic Pre 30 %    Psych/Spiritual Pre 30 %    Family Pre 24 %    GLOBAL Pre 24.88 %         Scores of 19 and below usually indicate a poorer quality of life in these areas.  A difference of  2-3 points is a clinically meaningful difference.  A difference of 2-3 points in the total score of the Quality of Life Index has been associated with significant improvement in overall quality of life, self-image, physical symptoms, and general health in studies assessing change in quality of life.  PHQ-9: Review Flowsheet       10/26/2024 09/06/2024  Depression screen PHQ 2/9  Decreased Interest 0 0  Down, Depressed, Hopeless 0 0  PHQ -  2 Score 0 0  Altered sleeping 3 -  Tired, decreased energy 3 -  Change in appetite 3 -  Feeling bad or failure about yourself  0 -  Trouble concentrating 0 -   Moving slowly or fidgety/restless 0 -  Suicidal thoughts 0 -  PHQ-9 Score 9 -  Difficult doing work/chores Not difficult at all -   Interpretation of Total Score  Total Score Depression Severity:  1-4 = Minimal depression, 5-9 = Mild depression, 10-14 = Moderate depression, 15-19 = Moderately severe depression, 20-27 = Severe depression   Psychosocial Evaluation and Intervention:   Psychosocial Re-Evaluation:   Psychosocial Discharge (Final Psychosocial Re-Evaluation):   Vocational Rehabilitation: Provide vocational rehab assistance to qualifying candidates.   Vocational Rehab Evaluation & Intervention:  Vocational Rehab - 10/26/24 1557       Initial Vocational Rehab Evaluation & Intervention   Assessment shows need for Vocational Rehabilitation No   Pt is retired         Education: Education Goals: Education classes will be provided on a weekly basis, covering required topics. Participant will state understanding/return demonstration of topics presented.     Core Videos: Exercise    Move It!  Clinical staff conducted group or individual video education with verbal and written material and guidebook.  Patient learns the recommended Pritikin exercise program. Exercise with the goal of living a long, healthy life. Some of the health benefits of exercise include controlled diabetes, healthier blood pressure levels, improved cholesterol levels, improved heart and lung capacity, improved sleep, and better body composition. Everyone should speak with their doctor before starting or changing an exercise routine.  Biomechanical Limitations Clinical staff conducted group or individual video education with verbal and written material and guidebook.  Patient learns how biomechanical limitations can impact exercise and how we can mitigate and possibly overcome limitations to have an impactful and balanced exercise routine.  Body Composition Clinical staff conducted group or  individual video education with verbal and written material and guidebook.  Patient learns that body composition (ratio of muscle mass to fat mass) is a key component to assessing overall fitness, rather than body weight alone. Increased fat mass, especially visceral belly fat, can put us  at increased risk for metabolic syndrome, type 2 diabetes, heart disease, and even death. It is recommended to combine diet and exercise (cardiovascular and resistance training) to improve your body composition. Seek guidance from your physician and exercise physiologist before implementing an exercise routine.  Exercise Action Plan Clinical staff conducted group or individual video education with verbal and written material and guidebook.  Patient learns the recommended strategies to achieve and enjoy long-term exercise adherence, including variety, self-motivation, self-efficacy, and positive decision making. Benefits of exercise include fitness, good health, weight management, more energy, better sleep, less stress, and overall well-being.  Medical   Heart Disease Risk Reduction Clinical staff conducted group or individual video education with verbal and written material and guidebook.  Patient learns our heart is our most vital organ as it circulates oxygen, nutrients, white blood cells, and hormones throughout the entire body, and carries waste away. Data supports a plant-based eating plan like the Pritikin Program for its effectiveness in slowing progression of and reversing heart disease. The video provides a number of recommendations to address heart disease.   Metabolic Syndrome and Belly Fat  Clinical staff conducted group or individual video education with verbal and written material and guidebook.  Patient learns what metabolic syndrome is, how it leads to heart  disease, and how one can reverse it and keep it from coming back. You have metabolic syndrome if you have 3 of the following 5 criteria: abdominal  obesity, high blood pressure, high triglycerides, low HDL cholesterol, and high blood sugar.  Hypertension and Heart Disease Clinical staff conducted group or individual video education with verbal and written material and guidebook.  Patient learns that high blood pressure, or hypertension, is very common in the [place] . Hypertension is largely due to excessive salt intake, but other important risk factors include being overweight, physical inactivity, drinking too much alcohol, smoking, and not eating enough potassium from fruits and vegetables. High blood pressure is a leading risk factor for heart attack, stroke, congestive heart failure, dementia, kidney failure, and premature death. Long-term effects of excessive salt intake include stiffening of the arteries and thickening of heart muscle and organ damage. Recommendations include ways to reduce hypertension and the risk of heart disease.  Diseases of Our Time - Focusing on Diabetes Clinical staff conducted group or individual video education with verbal and written material and guidebook.  Patient learns why the best way to stop diseases of our time is prevention, through food and other lifestyle changes. Medicine (such as prescription pills and surgeries) is often only a Band-Aid on the problem, not a long-term solution. Most common diseases of our time include obesity, type 2 diabetes, hypertension, heart disease, and cancer. The Pritikin Program is recommended and has been proven to help reduce, reverse, and/or prevent the damaging effects of metabolic syndrome.  Nutrition   Overview of the Pritikin Eating Plan  Clinical staff conducted group or individual video education with verbal and written material and guidebook.  Patient learns about the Pritikin Eating Plan for disease risk reduction. The Pritikin Eating Plan emphasizes a wide variety of unrefined, minimally-processed carbohydrates, like fruits, vegetables, whole grains, and  legumes. Go, Caution, and Stop food choices are explained. Plant-based and lean animal proteins are emphasized. Rationale provided for low sodium intake for blood pressure control, low added sugars for blood sugar stabilization, and low added fats and oils for coronary artery disease risk reduction and weight management.  Calorie Density  Clinical staff conducted group or individual video education with verbal and written material and guidebook.  Patient learns about calorie density and how it impacts the Pritikin Eating Plan. Knowing the characteristics of the food you choose will help you decide whether those foods will lead to weight gain or weight loss, and whether you want to consume more or less of them. Weight loss is usually a side effect of the Pritikin Eating Plan because of its focus on low calorie-dense foods.  Label Reading  Clinical staff conducted group or individual video education with verbal and written material and guidebook.  Patient learns about the Pritikin recommended label reading guidelines and corresponding recommendations regarding calorie density, added sugars, sodium content, and whole grains.  Dining Out - Part 1  Clinical staff conducted group or individual video education with verbal and written material and guidebook.  Patient learns that restaurant meals can be sabotaging because they can be so high in calories, fat, sodium, and/or sugar. Patient learns recommended strategies on how to positively address this and avoid unhealthy pitfalls.  Facts on Fats  Clinical staff conducted group or individual video education with verbal and written material and guidebook.  Patient learns that lifestyle modifications can be just as effective, if not more so, as many medications for lowering your risk of heart disease. A Pritikin  lifestyle can help to reduce your risk of inflammation and atherosclerosis (cholesterol build-up, or plaque, in the artery walls). Lifestyle  interventions such as dietary choices and physical activity address the cause of atherosclerosis. A review of the types of fats and their impact on blood cholesterol levels, along with dietary recommendations to reduce fat intake is also included.  Nutrition Action Plan  Clinical staff conducted group or individual video education with verbal and written material and guidebook.  Patient learns how to incorporate Pritikin recommendations into their lifestyle. Recommendations include planning and keeping personal health goals in mind as an important part of their success.  Healthy Mind-Set    Healthy Minds, Bodies, Hearts  Clinical staff conducted group or individual video education with verbal and written material and guidebook.  Patient learns how to identify when they are stressed. Video will discuss the impact of that stress, as well as the many benefits of stress management. Patient will also be introduced to stress management techniques. The way we think, act, and feel has an impact on our hearts.  How Our Thoughts Can Heal Our Hearts  Clinical staff conducted group or individual video education with verbal and written material and guidebook.  Patient learns that negative thoughts can cause depression and anxiety. This can result in negative lifestyle behavior and serious health problems. Cognitive behavioral therapy is an effective method to help control our thoughts in order to change and improve our emotional outlook.  Additional Videos:  Exercise    Improving Performance  Clinical staff conducted group or individual video education with verbal and written material and guidebook.  Patient learns to use a non-linear approach by alternating intensity levels and lengths of time spent exercising to help burn more calories and lose more body fat. Cardiovascular exercise helps improve heart health, metabolism, hormonal balance, blood sugar control, and recovery from fatigue. Resistance training  improves strength, endurance, balance, coordination, reaction time, metabolism, and muscle mass. Flexibility exercise improves circulation, posture, and balance. Seek guidance from your physician and exercise physiologist before implementing an exercise routine and learn your capabilities and proper form for all exercise.  Introduction to Yoga  Clinical staff conducted group or individual video education with verbal and written material and guidebook.  Patient learns about yoga, a discipline of the coming together of mind, breath, and body. The benefits of yoga include improved flexibility, improved range of motion, better posture and core strength, increased lung function, weight loss, and positive self-image. Yogas heart health benefits include lowered blood pressure, healthier heart rate, decreased cholesterol and triglyceride levels, improved immune function, and reduced stress. Seek guidance from your physician and exercise physiologist before implementing an exercise routine and learn your capabilities and proper form for all exercise.  Medical   Aging: Enhancing Your Quality of Life  Clinical staff conducted group or individual video education with verbal and written material and guidebook.  Patient learns key strategies and recommendations to stay in good physical health and enhance quality of life, such as prevention strategies, having an advocate, securing a Health Care Proxy and Power of Attorney, and keeping a list of medications and system for tracking them. It also discusses how to avoid risk for bone loss.  Biology of Weight Control  Clinical staff conducted group or individual video education with verbal and written material and guidebook.  Patient learns that weight gain occurs because we consume more calories than we burn (eating more, moving less). Even if your body weight is normal, you may have higher  ratios of fat compared to muscle mass. Too much body fat puts you at increased  risk for cardiovascular disease, heart attack, stroke, type 2 diabetes, and obesity-related cancers. In addition to exercise, following the Pritikin Eating Plan can help reduce your risk.  Decoding Lab Results  Clinical staff conducted group or individual video education with verbal and written material and guidebook.  Patient learns that lab test reflects one measurement whose values change over time and are influenced by many factors, including medication, stress, sleep, exercise, food, hydration, pre-existing medical conditions, and more. It is recommended to use the knowledge from this video to become more involved with your lab results and evaluate your numbers to speak with your doctor.   Diseases of Our Time - Overview  Clinical staff conducted group or individual video education with verbal and written material and guidebook.  Patient learns that according to the CDC, 50% to 70% of chronic diseases (such as obesity, type 2 diabetes, elevated lipids, hypertension, and heart disease) are avoidable through lifestyle improvements including healthier food choices, listening to satiety cues, and increased physical activity.  Sleep Disorders Clinical staff conducted group or individual video education with verbal and written material and guidebook.  Patient learns how good quality and duration of sleep are important to overall health and well-being. Patient also learns about sleep disorders and how they impact health along with recommendations to address them, including discussing with a physician.  Nutrition  Dining Out - Part 2 Clinical staff conducted group or individual video education with verbal and written material and guidebook.  Patient learns how to plan ahead and communicate in order to maximize their dining experience in a healthy and nutritious manner. Included are recommended food choices based on the type of restaurant the patient is visiting.   Fueling a Tax Inspector conducted group or individual video education with verbal and written material and guidebook.  There is a strong connection between our food choices and our health. Diseases like obesity and type 2 diabetes are very prevalent and are in large-part due to lifestyle choices. The Pritikin Eating Plan provides plenty of food and hunger-curbing satisfaction. It is easy to follow, affordable, and helps reduce health risks.  Menu Workshop  Clinical staff conducted group or individual video education with verbal and written material and guidebook.  Patient learns that restaurant meals can sabotage health goals because they are often packed with calories, fat, sodium, and sugar. Recommendations include strategies to plan ahead and to communicate with the manager, chef, or server to help order a healthier meal.  Planning Your Eating Strategy  Clinical staff conducted group or individual video education with verbal and written material and guidebook.  Patient learns about the Pritikin Eating Plan and its benefit of reducing the risk of disease. The Pritikin Eating Plan does not focus on calories. Instead, it emphasizes high-quality, nutrient-rich foods. By knowing the characteristics of the foods, we choose, we can determine their calorie density and make informed decisions.  Targeting Your Nutrition Priorities  Clinical staff conducted group or individual video education with verbal and written material and guidebook.  Patient learns that lifestyle habits have a tremendous impact on disease risk and progression. This video provides eating and physical activity recommendations based on your personal health goals, such as reducing LDL cholesterol, losing weight, preventing or controlling type 2 diabetes, and reducing high blood pressure.  Vitamins and Minerals  Clinical staff conducted group or individual video education with verbal and written  material and guidebook.  Patient learns different ways to  obtain key vitamins and minerals, including through a recommended healthy diet. It is important to discuss all supplements you take with your doctor.   Healthy Mind-Set    Smoking Cessation  Clinical staff conducted group or individual video education with verbal and written material and guidebook.  Patient learns that cigarette smoking and tobacco addiction pose a serious health risk which affects millions of people. Stopping smoking will significantly reduce the risk of heart disease, lung disease, and many forms of cancer. Recommended strategies for quitting are covered, including working with your doctor to develop a successful plan.  Culinary   Becoming a Set Designer conducted group or individual video education with verbal and written material and guidebook.  Patient learns that cooking at home can be healthy, cost-effective, quick, and puts them in control. Keys to cooking healthy recipes will include looking at your recipe, assessing your equipment needs, planning ahead, making it simple, choosing cost-effective seasonal ingredients, and limiting the use of added fats, salts, and sugars.  Cooking - Breakfast and Snacks  Clinical staff conducted group or individual video education with verbal and written material and guidebook.  Patient learns how important breakfast is to satiety and nutrition through the entire day. Recommendations include key foods to eat during breakfast to help stabilize blood sugar levels and to prevent overeating at meals later in the day. Planning ahead is also a key component.  Cooking - Educational Psychologist conducted group or individual video education with verbal and written material and guidebook.  Patient learns eating strategies to improve overall health, including an approach to cook more at home. Recommendations include thinking of animal protein as a side on your plate rather than center stage and focusing instead on lower  calorie dense options like vegetables, fruits, whole grains, and plant-based proteins, such as beans. Making sauces in large quantities to freeze for later and leaving the skin on your vegetables are also recommended to maximize your experience.  Cooking - Healthy Salads and Dressing Clinical staff conducted group or individual video education with verbal and written material and guidebook.  Patient learns that vegetables, fruits, whole grains, and legumes are the foundations of the Pritikin Eating Plan. Recommendations include how to incorporate each of these in flavorful and healthy salads, and how to create homemade salad dressings. Proper handling of ingredients is also covered. Cooking - Soups and [employer] - Soups and Desserts Clinical staff conducted group or individual video education with verbal and written material and guidebook.  Patient learns that Pritikin soups and desserts make for easy, nutritious, and delicious snacks and meal components that are low in sodium, fat, sugar, and calorie density, while high in vitamins, minerals, and filling fiber. Recommendations include simple and healthy ideas for soups and desserts.   Overview     The Pritikin Solution Program Overview Clinical staff conducted group or individual video education with verbal and written material and guidebook.  Patient learns that the results of the Pritikin Program have been documented in more than 100 articles published in peer-reviewed journals, and the benefits include reducing risk factors for (and, in some cases, even reversing) high cholesterol, high blood pressure, type 2 diabetes, obesity, and more! An overview of the three key pillars of the Pritikin Program will be covered: eating well, doing regular exercise, and having a healthy mind-set.  WORKSHOPS  Exercise: Exercise Basics: Building Your Action Plan Clinical staff  led group instruction and group discussion with PowerPoint presentation and  patient guidebook. To enhance the learning environment the use of posters, models and videos may be added. At the conclusion of this workshop, patients will comprehend the difference between physical activity and exercise, as well as the benefits of incorporating both, into their routine. Patients will understand the FITT (Frequency, Intensity, Time, and Type) principle and how to use it to build an exercise action plan. In addition, safety concerns and other considerations for exercise and cardiac rehab will be addressed by the presenter. The purpose of this lesson is to promote a comprehensive and effective weekly exercise routine in order to improve patients overall level of fitness.   Managing Heart Disease: Your Path to a Healthier Heart Clinical staff led group instruction and group discussion with PowerPoint presentation and patient guidebook. To enhance the learning environment the use of posters, models and videos may be added.At the conclusion of this workshop, patients will understand the anatomy and physiology of the heart. Additionally, they will understand how Pritikins three pillars impact the risk factors, the progression, and the management of heart disease.  The purpose of this lesson is to provide a high-level overview of the heart, heart disease, and how the Pritikin lifestyle positively impacts risk factors.  Exercise Biomechanics Clinical staff led group instruction and group discussion with PowerPoint presentation and patient guidebook. To enhance the learning environment the use of posters, models and videos may be added. Patients will learn how the structural parts of their bodies function and how these functions impact their daily activities, movement, and exercise. Patients will learn how to promote a neutral spine, learn how to manage pain, and identify ways to improve their physical movement in order to promote healthy living. The purpose of this lesson is to expose  patients to common physical limitations that impact physical activity. Participants will learn practical ways to adapt and manage aches and pains, and to minimize their effect on regular exercise. Patients will learn how to maintain good posture while sitting, walking, and lifting.  Balance Training and Fall Prevention  Clinical staff led group instruction and group discussion with PowerPoint presentation and patient guidebook. To enhance the learning environment the use of posters, models and videos may be added. At the conclusion of this workshop, patients will understand the importance of their sensorimotor skills (vision, proprioception, and the vestibular system) in maintaining their ability to balance as they age. Patients will apply a variety of balancing exercises that are appropriate for their current level of function. Patients will understand the common causes for poor balance, possible solutions to these problems, and ways to modify their physical environment in order to minimize their fall risk. The purpose of this lesson is to teach patients about the importance of maintaining balance as they age and ways to minimize their risk of falling.  WORKSHOPS   Nutrition:  Fueling a Ship Broker led group instruction and group discussion with PowerPoint presentation and patient guidebook. To enhance the learning environment the use of posters, models and videos may be added. Patients will review the foundational principles of the Pritikin Eating Plan and understand what constitutes a serving size in each of the food groups. Patients will also learn Pritikin-friendly foods that are better choices when away from home and review make-ahead meal and snack options. Calorie density will be reviewed and applied to three nutrition priorities: weight maintenance, weight loss, and weight gain. The purpose of this lesson is to reinforce (  in a group setting) the key concepts around what  patients are recommended to eat and how to apply these guidelines when away from home by planning and selecting Pritikin-friendly options. Patients will understand how calorie density may be adjusted for different weight management goals.  Mindful Eating  Clinical staff led group instruction and group discussion with PowerPoint presentation and patient guidebook. To enhance the learning environment the use of posters, models and videos may be added. Patients will briefly review the concepts of the Pritikin Eating Plan and the importance of low-calorie dense foods. The concept of mindful eating will be introduced as well as the importance of paying attention to internal hunger signals. Triggers for non-hunger eating and techniques for dealing with triggers will be explored. The purpose of this lesson is to provide patients with the opportunity to review the basic principles of the Pritikin Eating Plan, discuss the value of eating mindfully and how to measure internal cues of hunger and fullness using the Hunger Scale. Patients will also discuss reasons for non-hunger eating and learn strategies to use for controlling emotional eating.  Targeting Your Nutrition Priorities Clinical staff led group instruction and group discussion with PowerPoint presentation and patient guidebook. To enhance the learning environment the use of posters, models and videos may be added. Patients will learn how to determine their genetic susceptibility to disease by reviewing their family history. Patients will gain insight into the importance of diet as part of an overall healthy lifestyle in mitigating the impact of genetics and other environmental insults. The purpose of this lesson is to provide patients with the opportunity to assess their personal nutrition priorities by looking at their family history, their own health history and current risk factors. Patients will also be able to discuss ways of prioritizing and modifying  the Pritikin Eating Plan for their highest risk areas  Menu  Clinical staff led group instruction and group discussion with PowerPoint presentation and patient guidebook. To enhance the learning environment the use of posters, models and videos may be added. Using menus brought in from [employer], or printed from [employer], patients will apply the Pritikin dining out guidelines that were presented in the [employer] video. Patients will also be able to practice these guidelines in a variety of provided scenarios. The purpose of this lesson is to provide patients with the opportunity to practice hands-on learning of the Pritikin Dining Out guidelines with actual menus and practice scenarios.  Label Reading Clinical staff led group instruction and group discussion with PowerPoint presentation and patient guidebook. To enhance the learning environment the use of posters, models and videos may be added. Patients will review and discuss the Pritikin label reading guidelines presented in Pritikins Label Reading Educational series video. Using fool labels brought in from local grocery stores and markets, patients will apply the label reading guidelines and determine if the packaged food meet the Pritikin guidelines. The purpose of this lesson is to provide patients with the opportunity to review, discuss, and practice hands-on learning of the Pritikin Label Reading guidelines with actual packaged food labels. Cooking School  Pritikins [employer] are designed to teach patients ways to prepare quick, simple, and affordable recipes at home. The importance of nutritions role in chronic disease risk reduction is reflected in its emphasis in the overall Pritikin program. By learning how to prepare essential core Pritikin Eating Plan recipes, patients will increase control over what they eat; be able to customize the flavor of foods  without the use of added salt, sugar, or  fat; and improve the quality of the food they consume. By learning a set of core recipes which are easily assembled, quickly prepared, and affordable, patients are more likely to prepare more healthy foods at home. These workshops focus on convenient breakfasts, simple entres, side dishes, and desserts which can be prepared with minimal effort and are consistent with nutrition recommendations for cardiovascular risk reduction. Cooking [employer] are taught by a armed forces logistics/support/administrative officer (RD) who has been trained by the [employer]. The chef or RD has a clear understanding of the importance of minimizing - if not completely eliminating - added fat, sugar, and sodium in recipes. Throughout the series of Cooking School Workshop sessions, patients will learn about healthy ingredients and efficient methods of cooking to build confidence in their capability to prepare    Cooking School weekly topics:  Adding Flavor- Sodium-Free  Fast and Healthy Breakfasts  Powerhouse Plant-Based Proteins  Satisfying Salads and Dressings  Simple Sides and Sauces  International Cuisine-Spotlight on the [employer] Zones  Delicious Desserts  Savory Soups  [employer] - Meals in a Astronomer Appetizers and Snacks  Comforting Weekend Breakfasts  One-Pot Wonders   Fast Evening Meals  Landscape Architect Your Pritikin Plate  WORKSHOPS   Healthy Mindset (Psychosocial):  Focused Goals, Sustainable Changes Clinical staff led group instruction and group discussion with PowerPoint presentation and patient guidebook. To enhance the learning environment the use of posters, models and videos may be added. Patients will be able to apply effective goal setting strategies to establish at least one personal goal, and then take consistent, meaningful action toward that goal. They will learn to identify common barriers to achieving personal goals and develop strategies to overcome them. Patients  will also gain an understanding of how our mind-set can impact our ability to achieve goals and the importance of cultivating a positive and growth-oriented mind-set. The purpose of this lesson is to provide patients with a deeper understanding of how to set and achieve personal goals, as well as the tools and strategies needed to overcome common obstacles which may arise along the way.  From Head to Heart: The Power of a Healthy Outlook  Clinical staff led group instruction and group discussion with PowerPoint presentation and patient guidebook. To enhance the learning environment the use of posters, models and videos may be added. Patients will be able to recognize and describe the impact of emotions and mood on physical health. They will discover the importance of self-care and explore self-care practices which may work for them. Patients will also learn how to utilize the 4 Cs to cultivate a healthier outlook and better manage stress and challenges. The purpose of this lesson is to demonstrate to patients how a healthy outlook is an essential part of maintaining good health, especially as they continue their cardiac rehab journey.  Healthy Sleep for a Healthy Heart Clinical staff led group instruction and group discussion with PowerPoint presentation and patient guidebook. To enhance the learning environment the use of posters, models and videos may be added. At the conclusion of this workshop, patients will be able to demonstrate knowledge of the importance of sleep to overall health, well-being, and quality of life. They will understand the symptoms of, and treatments for, common sleep disorders. Patients will also be able to identify daytime and nighttime behaviors which impact sleep, and they will be able to apply these tools to help  manage sleep-related challenges. The purpose of this lesson is to provide patients with a general overview of sleep and outline the importance of quality sleep. Patients  will learn about a few of the most common sleep disorders. Patients will also be introduced to the concept of sleep hygiene, and discover ways to self-manage certain sleeping problems through simple daily behavior changes. Finally, the workshop will motivate patients by clarifying the links between quality sleep and their goals of heart-healthy living.   Recognizing and Reducing Stress Clinical staff led group instruction and group discussion with PowerPoint presentation and patient guidebook. To enhance the learning environment the use of posters, models and videos may be added. At the conclusion of this workshop, patients will be able to understand the types of stress reactions, differentiate between acute and chronic stress, and recognize the impact that chronic stress has on their health. They will also be able to apply different coping mechanisms, such as reframing negative self-talk. Patients will have the opportunity to practice a variety of stress management techniques, such as deep abdominal breathing, progressive muscle relaxation, and/or guided imagery.  The purpose of this lesson is to educate patients on the role of stress in their lives and to provide healthy techniques for coping with it.  Learning Barriers/Preferences:  Learning Barriers/Preferences - 10/26/24 1556       Learning Barriers/Preferences   Learning Barriers Hearing    Learning Preferences Individual Instruction;Group Instruction;Skilled Demonstration          Education Topics:  Knowledge Questionnaire Score:  Knowledge Questionnaire Score - 10/26/24 1414       Knowledge Questionnaire Score   Pre Score 23/24          Core Components/Risk Factors/Patient Goals at Admission:  Personal Goals and Risk Factors at Admission - 10/26/24 1557       Core Components/Risk Factors/Patient Goals on Admission    Weight Management Yes;Obesity;Weight Loss    Intervention Weight Management: Develop a combined nutrition  and exercise program designed to reach desired caloric intake, while maintaining appropriate intake of nutrient and fiber, sodium and fats, and appropriate energy expenditure required for the weight goal.;Weight Management: Provide education and appropriate resources to help participant work on and attain dietary goals.;Weight Management/Obesity: Establish reasonable short term and long term weight goals.;Obesity: Provide education and appropriate resources to help participant work on and attain dietary goals.    Admit Weight 287 lb 11.2 oz (130.5 kg)    Goal Weight: Short Term 275 lb (124.7 kg)    Goal Weight: Long Term 200 lb (90.7 kg)    Expected Outcomes Short Term: Continue to assess and modify interventions until short term weight is achieved;Long Term: Adherence to nutrition and physical activity/exercise program aimed toward attainment of established weight goal;Weight Loss: Understanding of general recommendations for a balanced deficit meal plan, which promotes 1-2 lb weight loss per week and includes a negative energy balance of [phone] kcal/d;Understanding recommendations for meals to include 15-35% energy as protein, 25-35% energy from fat, 35-60% energy from carbohydrates, less than 200mg  of dietary cholesterol, 20-35 gm of total fiber daily;Understanding of distribution of calorie intake throughout the day with the consumption of 4-5 meals/snacks    Diabetes Yes    Intervention Provide education about signs/symptoms and action to take for hypo/hyperglycemia.;Provide education about proper nutrition, including hydration, and aerobic/resistive exercise prescription along with prescribed medications to achieve blood glucose in normal ranges: Fasting glucose 65-99 mg/dL    Expected Outcomes Short Term: Participant verbalizes understanding of  the signs/symptoms and immediate care of hyper/hypoglycemia, proper foot care and importance of medication, aerobic/resistive exercise and nutrition plan for  blood glucose control.;Long Term: Attainment of HbA1C < 7%.    Hypertension Yes    Intervention Provide education on lifestyle modifcations including regular physical activity/exercise, weight management, moderate sodium restriction and increased consumption of fresh fruit, vegetables, and low fat dairy, alcohol moderation, and smoking cessation.;Monitor prescription use compliance.    Expected Outcomes Short Term: Continued assessment and intervention until BP is < 140/69mm HG in hypertensive participants. < 130/7mm HG in hypertensive participants with diabetes, heart failure or chronic kidney disease.;Long Term: Maintenance of blood pressure at goal levels.    Lipids Yes    Intervention Provide education and support for participant on nutrition & aerobic/resistive exercise along with prescribed medications to achieve LDL 70mg , HDL >40mg .    Expected Outcomes Short Term: Participant states understanding of desired cholesterol values and is compliant with medications prescribed. Participant is following exercise prescription and nutrition guidelines.;Long Term: Cholesterol controlled with medications as prescribed, with individualized exercise RX and with personalized nutrition plan. Value goals: LDL < 70mg , HDL > 40 mg.    Stress Yes    Intervention Offer individual and/or small group education and counseling on adjustment to heart disease, stress management and health-related lifestyle change. Teach and support self-help strategies.;Refer participants experiencing significant psychosocial distress to appropriate mental health specialists for further evaluation and treatment. When possible, include family members and significant others in education/counseling sessions.    Expected Outcomes Short Term: Participant demonstrates changes in health-related behavior, relaxation and other stress management skills, ability to obtain effective social support, and compliance with psychotropic medications if  prescribed.;Long Term: Emotional wellbeing is indicated by absence of clinically significant psychosocial distress or social isolation.          Core Components/Risk Factors/Patient Goals Review:    Core Components/Risk Factors/Patient Goals at Discharge (Final Review):    ITP Comments:  ITP Comments     Row Name 10/26/24 1304           ITP Comments Wilbert Holland, MD: Medical Director.  Introduction to the Pritikin education Program/Intensive Cardiac Rehab.  Initial orientation packet reviewed with the patient.          Comments: Participant attended orientation for the cardiac rehabilitation program on  10/26/2024  to perform initial intake and exercise walk test. Patient introduced to the Pritikin Program education and orientation packet was reviewed. Completed 6-minute walk test, measurements, initial ITP, and exercise prescription. Vital signs stable. Telemetry-normal sinus rhythm, asymptomatic.   Service time was from 13:00 to 15:33.        [1]  Current Outpatient Medications:    amLODipine (NORVASC) 10 MG tablet, Take 10 mg by mouth daily., Disp: , Rfl:    aspirin EC 81 MG tablet, Take 81 mg by mouth daily. Swallow whole., Disp: , Rfl:    Blood Glucose Monitoring Suppl (ACCU-CHEK GUIDE) w/Device KIT, 1 Device by Does not apply route 3 (three) times daily., Disp: 1 kit, Rfl: 0   cetirizine (ZYRTEC) 10 MG tablet, Take 10 mg by mouth daily., Disp: , Rfl:    Continuous Glucose Sensor (DEXCOM G7 SENSOR) MISC, 1 Device by Does not apply route as directed. Change sensor every 10 days, Disp: 9 each, Rfl: 3   fenofibrate  micronized (LOFIBRA) 200 MG capsule, Take 1 capsule (200 mg total) by mouth daily before breakfast., Disp: 90 capsule, Rfl: 3   glucose blood (ACCU-CHEK GUIDE TEST) test strip,  1 each by Other route 3 (three) times daily. Use as instructed, Disp: 300 each, Rfl: 12   HYDROcodone-acetaminophen (NORCO) 7.5-325 MG tablet, Take 1 tablet by mouth 3 (three) times daily.,  Disp: , Rfl:    ibuprofen (ADVIL) 600 MG tablet, Take 600 mg by mouth every 8 (eight) hours as needed (pain)., Disp: , Rfl:    insulin  glargine, 2 Unit Dial, (TOUJEO  MAX SOLOSTAR) 300 UNIT/ML Solostar Pen, Inject 100 Units into the skin daily in the afternoon., Disp: 15 mL, Rfl: 3   insulin  lispro (HUMALOG  KWIKPEN) 200 UNIT/ML KwikPen, Inject 30 Units into the skin 3 (three) times daily., Disp: 15 mL, Rfl: 4   Insulin  Pen Needle 32G X 4 MM MISC, 1 Device by Does not apply route in the morning, at noon, in the evening, and at bedtime., Disp: 400 each, Rfl: 3   isosorbide  mononitrate (IMDUR ) 30 MG 24 hr tablet, Take 1 tablet (30 mg total) by mouth daily., Disp: 90 tablet, Rfl: 3   Levothyroxine Sodium (SYNTHROID PO), Take 50 mcg by mouth daily., Disp: , Rfl:    lisinopril-hydrochlorothiazide (ZESTORETIC) 20-25 MG tablet, Take 1 tablet by mouth daily., Disp: , Rfl:    metoprolol succinate (TOPROL-XL) 50 MG 24 hr tablet, Take 50 mg by mouth daily., Disp: , Rfl:    nitroGLYCERIN  (NITROSTAT ) 0.4 MG SL tablet, Place 1 tablet (0.4 mg total) under the tongue every 5 (five) minutes as needed for chest pain., Disp: 25 tablet, Rfl: 3   rosuvastatin  (CRESTOR ) 20 MG tablet, Take 1 tablet (20 mg total) by mouth daily., Disp: 90 tablet, Rfl: 3   sertraline (ZOLOFT) 50 MG tablet, Take 50 mg by mouth daily., Disp: , Rfl:    Accu-Chek FastClix Lancets MISC, Check blood sugar three times daily, Disp: 300 each, Rfl: 1 [2]  Social History Tobacco Use  Smoking Status Never   Passive exposure: Current  Smokeless Tobacco Never

## 2024-11-01 ENCOUNTER — Encounter (HOSPITAL_COMMUNITY)
Admission: RE | Admit: 2024-11-01 | Discharge: 2024-11-01 | Disposition: A | Source: Ambulatory Visit | Attending: Cardiology

## 2024-11-01 DIAGNOSIS — I2089 Other forms of angina pectoris: Secondary | ICD-10-CM

## 2024-11-01 LAB — GLUCOSE, CAPILLARY
Glucose-Capillary: 148 mg/dL — ABNORMAL HIGH (ref 70–99)
Glucose-Capillary: 261 mg/dL — ABNORMAL HIGH (ref 70–99)

## 2024-11-01 NOTE — Progress Notes (Signed)
 Cardiac Individual Treatment Plan  Patient Details  Name: Ronald Bauer MRN: [id number] Date of Birth: Sep 08, 1961 Referring Provider:   Flowsheet Row INTENSIVE CARDIAC REHAB ORIENT from 10/26/2024 in [facility]  Referring Provider Shelda Bruckner, MD    Initial Encounter Date:  Flowsheet Row INTENSIVE CARDIAC REHAB ORIENT from 10/26/2024 in [facility]  Date 10/26/24    Visit Diagnosis: Stable angina  Patient's Home Medications on Admission: Current Medications[1]  Past Medical History: Past Medical History:  Diagnosis Date   Diabetes mellitus without complication (HCC)    Hypertension    Thyroid disease     Tobacco Use: Tobacco Use History[2]  Labs: Review Flowsheet        No data to display          Capillary Blood Glucose: No results found for: GLUCAP   Exercise Target Goals: Exercise Program Goal: Individual exercise prescription set using results from initial 6 min walk test and THRR while considering  patients activity barriers and safety.   Exercise Prescription Goal: Initial exercise prescription builds to 30-45 minutes a day of aerobic activity, 2-3 days per week.  Home exercise guidelines will be given to patient during program as part of exercise prescription that the participant will acknowledge.  Activity Barriers & Risk Stratification:  Activity Barriers & Cardiac Risk Stratification - 10/26/24 1416       Activity Barriers & Cardiac Risk Stratification   Activity Barriers Arthritis;Deconditioning;Muscular Weakness;Balance Concerns    Cardiac Risk Stratification High          6 Minute Walk:  6 Minute Walk     Row Name 10/26/24 1516         6 Minute Walk   Phase Initial     Distance 850 feet     Walk Time 6 minutes     # of Rest Breaks 1  4:15-5:12 due to chronic rt hip pain     MPH 1.61     METS 1.63      RPE 11     Perceived Dyspnea  0     VO2 Peak 5.7     Symptoms Yes (comment)     Comments Chronic rt hip pain 6/10, chronic left ankle pain 4/10     Resting HR 63 bpm     Resting BP 142/50     Resting Oxygen Saturation  97 %     Exercise Oxygen Saturation  during 6 min walk 96 %     Max Ex. HR 84 bpm     Max Ex. BP 156/68     2 Minute Post BP 140/64        Oxygen Initial Assessment:   Oxygen Re-Evaluation:   Oxygen Discharge (Final Oxygen Re-Evaluation):   Initial Exercise Prescription:  Initial Exercise Prescription - 10/26/24 1400       Date of Initial Exercise RX and Referring Provider   Date 10/26/24    Referring Provider Shelda Bruckner, MD    Expected Discharge Date 01/19/25      NuStep   Level 1    SPM 75    Minutes 15    METs 1.6      Arm Ergometer   Level 1    Watts 25    RPM 60    Minutes 15    METs 1.6      Prescription Details   Frequency (times per  week) 3    Duration Progress to 30 minutes of continuous aerobic without signs/symptoms of physical distress      Intensity   THRR 40-80% of Max Heartrate 62-124    Ratings of Perceived Exertion 11-13    Perceived Dyspnea 0-4      Progression   Progression Continue progressive overload as per policy without signs/symptoms or physical distress.      Resistance Training   Training Prescription Yes    Reps 10-15          Perform Capillary Blood Glucose checks as needed.  Exercise Prescription Changes:   Exercise Comments:   Exercise Comments     Row Name 11/01/24 1427           Exercise Comments Pt's firs tday in the program. Pt tolerated session well and exercised without complaints.          Exercise Goals and Review:   Exercise Goals     Row Name 10/26/24 1414             Exercise Goals   Increase Physical Activity Yes       Intervention Provide advice, education, support and counseling about physical activity/exercise needs.;Develop an individualized  exercise prescription for aerobic and resistive training based on initial evaluation findings, risk stratification, comorbidities and participant's personal goals.       Expected Outcomes Short Term: Attend rehab on a regular basis to increase amount of physical activity.;Long Term: Exercising regularly at least 3-5 days a week.;Long Term: Add in home exercise to make exercise part of routine and to increase amount of physical activity.       Increase Strength and Stamina Yes       Intervention Provide advice, education, support and counseling about physical activity/exercise needs.;Develop an individualized exercise prescription for aerobic and resistive training based on initial evaluation findings, risk stratification, comorbidities and participant's personal goals.       Expected Outcomes Short Term: Increase workloads from initial exercise prescription for resistance, speed, and METs.;Short Term: Perform resistance training exercises routinely during rehab and add in resistance training at home;Long Term: Improve cardiorespiratory fitness, muscular endurance and strength as measured by increased METs and functional capacity ( )       Able to understand and use rate of perceived exertion (RPE) scale Yes       Intervention Provide education and explanation on how to use RPE scale       Expected Outcomes Short Term: Able to use RPE daily in rehab to express subjective intensity level;Long Term:  Able to use RPE to guide intensity level when exercising independently       Knowledge and understanding of Target Heart Rate Range (THRR) Yes       Intervention Provide education and explanation of THRR including how the numbers were predicted and where they are located for reference       Expected Outcomes Short Term: Able to state/look up THRR;Long Term: Able to use THRR to govern intensity when exercising independently;Short Term: Able to use daily as guideline for intensity in rehab       Understanding  of Exercise Prescription Yes       Intervention Provide education, explanation, and written materials on patient's individual exercise prescription       Expected Outcomes Short Term: Able to explain program exercise prescription;Long Term: Able to explain home exercise prescription to exercise independently          Exercise Goals Re-Evaluation :  Exercise  Goals Re-Evaluation     Row Name 11/01/24 1426             Exercise Goal Re-Evaluation   Exercise Goals Review Increase Physical Activity;Increase Strength and Stamina;Able to understand and use rate of perceived exertion (RPE) scale;Knowledge and understanding of Target Heart Rate Range (THRR);Understanding of Exercise Prescription       Comments Pt's first day in the [facility] program. Pt understands the exercisr Rx, RPE sclae anf THRR.       Expected Outcomes Will continue to monitor the patient and progress exercise workloads as tolerated.          Discharge Exercise Prescription (Final Exercise Prescription Changes):   Nutrition:  Target Goals: Understanding of nutrition guidelines, daily intake of sodium 1500mg , cholesterol 200mg , calories 30% from fat and 7% or less from saturated fats, daily to have 5 or more servings of fruits and vegetables.  Biometrics:  Pre Biometrics - 10/26/24 1330       Pre Biometrics   Waist Circumference 55 inches    Hip Circumference 47.5 inches    Waist to Hip Ratio 1.16 %    Triceps Skinfold 11 mm    % Body Fat 37.5 %    Grip Strength 38 kg    Flexibility --   Not performedd back issues   Single Leg Stand 6.8 seconds           Nutrition Therapy Plan and Nutrition Goals:  Nutrition Therapy & Goals - 11/01/24 1345       Nutrition Therapy   Diet Heart Healthy      Personal Nutrition Goals   Nutrition Goal Patient to identify strategies for reducing cardiovascular risk by attending the Pritikin education and nutrition series weekly.    Personal Goal #2 Patient to improve diet  quality by using the plate method as a guide for meal planning to include lean protein/plant protein, fruits, vegetables, whole grains, nonfat dairy as part of a well-balanced diet.    Personal Goal #3 Patient to limit sodium intake to <2300 mg per day.    Comments Patient with medical history significant for abnormal stress test consistent with CAD, stable angina, hypertension, type II diabetes, dyslipidemia, obesity. Unhealthy eating pattern based on PYP score < 40. Pt reports difficulty making dietary changes due to food preferences of family members; also reports getting most meals from restaurants. RD provided suggestions for increasing intake of fruit/vegetables as well as whole grains when eating out. Also discussed easy-to-prepare foods such as frozen veggies, canned tuna. RD encouraged pt to prepare meals at home when possible to help reduce sodium intake. Patient will benefit from participation in intensive cardiac rehab for nutrition education, exercise, and lifestyle modification.      Intervention Plan   Intervention Prescribe, educate and counsel regarding individualized specific dietary modifications aiming towards targeted core components such as weight, hypertension, lipid management, diabetes, heart failure and other comorbidities.;Nutrition handout(s) given to patient.   Handout: My Plate Planner   Expected Outcomes Short Term Goal: Understand basic principles of dietary content, such as calories, fat, sodium, cholesterol and nutrients.;Long Term Goal: Adherence to prescribed nutrition plan.          Nutrition Assessments:  MEDIFICTS Score Key: >=70 Need to make dietary changes  40-70 Heart Healthy Diet <= 40 Therapeutic Level Cholesterol Diet   Flowsheet Row INTENSIVE CARDIAC REHAB from 11/01/2024 in [facility]  Picture Your Plate Total Score on Admission  38   Picture Your Plate Scores: <59 Unhealthy dietary  pattern with much room for improvement. 41-50 Dietary pattern unlikely to meet recommendations for good health and room for improvement. 51-60 More healthful dietary pattern, with some room for improvement.  >60 Healthy dietary pattern, although there may be some specific behaviors that could be improved.    Nutrition Goals Re-Evaluation:   Nutrition Goals Re-Evaluation:   Nutrition Goals Discharge (Final Nutrition Goals Re-Evaluation):   Psychosocial: Target Goals: Acknowledge presence or absence of significant depression and/or stress, maximize coping skills, provide positive support system. Participant is able to verbalize types and ability to use techniques and skills needed for reducing stress and depression.  Initial Review & Psychosocial Screening:  Initial Psych Review & Screening - 10/26/24 1329       Initial Review   Current issues with Current Sleep Concerns;Current Stress Concerns    Source of Stress Concerns Chronic Illness      Family Dynamics   Good Support System? Yes   Pt has wife, brother-in-law, and two best friends as a part of his support system     Barriers   Psychosocial barriers to participate in program The patient should benefit from training in stress management and relaxation.      Screening Interventions   Interventions Encouraged to exercise;To provide support and resources with identified psychosocial needs;Provide feedback about the scores to participant    Expected Outcomes Long Term Goal: Stressors or current issues are controlled or eliminated.;Short Term goal: Identification and review with participant of any Quality of Life or Depression concerns found by scoring the questionnaire.;Long Term goal: The participant improves quality of Life and PHQ9 Scores as seen by post scores and/or verbalization of changes          Quality of Life Scores:  Quality of Life - 10/26/24 1413       Quality of Life   Select Quality of Life      Quality of  Life Scores   Health/Function Pre 20.4 %    Socioeconomic Pre 30 %    Psych/Spiritual Pre 30 %    Family Pre 24 %    GLOBAL Pre 24.88 %         Scores of 19 and below usually indicate a poorer quality of life in these areas.  A difference of  2-3 points is a clinically meaningful difference.  A difference of 2-3 points in the total score of the Quality of Life Index has been associated with significant improvement in overall quality of life, self-image, physical symptoms, and general health in studies assessing change in quality of life.  PHQ-9: Review Flowsheet       10/26/2024 09/06/2024  Depression screen PHQ 2/9  Decreased Interest 0 0  Down, Depressed, Hopeless 0 0  PHQ - 2 Score 0 0  Altered sleeping 3 -  Tired, decreased energy 3 -  Change in appetite 3 -  Feeling bad or failure about yourself  0 -  Trouble concentrating 0 -  Moving slowly or fidgety/restless 0 -  Suicidal thoughts 0 -  PHQ-9 Score 9 -  Difficult doing work/chores Not difficult at all -   Interpretation of Total Score  Total Score Depression Severity:  1-4 = Minimal depression, 5-9 = Mild depression, 10-14 = Moderate depression, 15-19 = Moderately severe depression, 20-27 = Severe depression   Psychosocial Evaluation and Intervention:   Psychosocial Re-Evaluation:   Psychosocial Discharge (Final Psychosocial Re-Evaluation):   Vocational Rehabilitation: Provide vocational  rehab assistance to qualifying candidates.   Vocational Rehab Evaluation & Intervention:  Vocational Rehab - 10/26/24 1557       Initial Vocational Rehab Evaluation & Intervention   Assessment shows need for Vocational Rehabilitation No   Pt is retired         Education: Education Goals: Education classes will be provided on a weekly basis, covering required topics. Participant will state understanding/return demonstration of topics presented.    Education     Row Name 11/01/24 1400     Education   Cardiac  Education Topics Pritikin   Psychologist, Forensic Psychosocial   Psychosocial How Our Thoughts Can Heal Our Hearts   Instruction Review Code 1- Verbalizes Understanding   Class Start Time 1400      Core Videos: Exercise    Move It!  Clinical staff conducted group or individual video education with verbal and written material and guidebook.  Patient learns the recommended Pritikin exercise program. Exercise with the goal of living a long, healthy life. Some of the health benefits of exercise include controlled diabetes, healthier blood pressure levels, improved cholesterol levels, improved heart and lung capacity, improved sleep, and better body composition. Everyone should speak with their doctor before starting or changing an exercise routine.  Biomechanical Limitations Clinical staff conducted group or individual video education with verbal and written material and guidebook.  Patient learns how biomechanical limitations can impact exercise and how we can mitigate and possibly overcome limitations to have an impactful and balanced exercise routine.  Body Composition Clinical staff conducted group or individual video education with verbal and written material and guidebook.  Patient learns that body composition (ratio of muscle mass to fat mass) is a key component to assessing overall fitness, rather than body weight alone. Increased fat mass, especially visceral belly fat, can put us  at increased risk for metabolic syndrome, type 2 diabetes, heart disease, and even death. It is recommended to combine diet and exercise (cardiovascular and resistance training) to improve your body composition. Seek guidance from your physician and exercise physiologist before implementing an exercise routine.  Exercise Action Plan Clinical staff conducted group or individual video education with verbal and written material and guidebook.  Patient  learns the recommended strategies to achieve and enjoy long-term exercise adherence, including variety, self-motivation, self-efficacy, and positive decision making. Benefits of exercise include fitness, good health, weight management, more energy, better sleep, less stress, and overall well-being.  Medical   Heart Disease Risk Reduction Clinical staff conducted group or individual video education with verbal and written material and guidebook.  Patient learns our heart is our most vital organ as it circulates oxygen, nutrients, white blood cells, and hormones throughout the entire body, and carries waste away. Data supports a plant-based eating plan like the Pritikin Program for its effectiveness in slowing progression of and reversing heart disease. The video provides a number of recommendations to address heart disease.   Metabolic Syndrome and Belly Fat  Clinical staff conducted group or individual video education with verbal and written material and guidebook.  Patient learns what metabolic syndrome is, how it leads to heart disease, and how one can reverse it and keep it from coming back. You have metabolic syndrome if you have 3 of the following 5 criteria: abdominal obesity, high blood pressure, high triglycerides, low HDL cholesterol, and high blood sugar.  Hypertension and Heart Disease Clinical staff conducted group or  individual video education with verbal and written material and guidebook.  Patient learns that high blood pressure, or hypertension, is very common in the [place] . Hypertension is largely due to excessive salt intake, but other important risk factors include being overweight, physical inactivity, drinking too much alcohol, smoking, and not eating enough potassium from fruits and vegetables. High blood pressure is a leading risk factor for heart attack, stroke, congestive heart failure, dementia, kidney failure, and premature death. Long-term effects of excessive salt  intake include stiffening of the arteries and thickening of heart muscle and organ damage. Recommendations include ways to reduce hypertension and the risk of heart disease.  Diseases of Our Time - Focusing on Diabetes Clinical staff conducted group or individual video education with verbal and written material and guidebook.  Patient learns why the best way to stop diseases of our time is prevention, through food and other lifestyle changes. Medicine (such as prescription pills and surgeries) is often only a Band-Aid on the problem, not a long-term solution. Most common diseases of our time include obesity, type 2 diabetes, hypertension, heart disease, and cancer. The Pritikin Program is recommended and has been proven to help reduce, reverse, and/or prevent the damaging effects of metabolic syndrome.  Nutrition   Overview of the Pritikin Eating Plan  Clinical staff conducted group or individual video education with verbal and written material and guidebook.  Patient learns about the Pritikin Eating Plan for disease risk reduction. The Pritikin Eating Plan emphasizes a wide variety of unrefined, minimally-processed carbohydrates, like fruits, vegetables, whole grains, and legumes. Go, Caution, and Stop food choices are explained. Plant-based and lean animal proteins are emphasized. Rationale provided for low sodium intake for blood pressure control, low added sugars for blood sugar stabilization, and low added fats and oils for coronary artery disease risk reduction and weight management.  Calorie Density  Clinical staff conducted group or individual video education with verbal and written material and guidebook.  Patient learns about calorie density and how it impacts the Pritikin Eating Plan. Knowing the characteristics of the food you choose will help you decide whether those foods will lead to weight gain or weight loss, and whether you want to consume more or less of them. Weight loss is usually a  side effect of the Pritikin Eating Plan because of its focus on low calorie-dense foods.  Label Reading  Clinical staff conducted group or individual video education with verbal and written material and guidebook.  Patient learns about the Pritikin recommended label reading guidelines and corresponding recommendations regarding calorie density, added sugars, sodium content, and whole grains.  Dining Out - Part 1  Clinical staff conducted group or individual video education with verbal and written material and guidebook.  Patient learns that restaurant meals can be sabotaging because they can be so high in calories, fat, sodium, and/or sugar. Patient learns recommended strategies on how to positively address this and avoid unhealthy pitfalls.  Facts on Fats  Clinical staff conducted group or individual video education with verbal and written material and guidebook.  Patient learns that lifestyle modifications can be just as effective, if not more so, as many medications for lowering your risk of heart disease. A Pritikin lifestyle can help to reduce your risk of inflammation and atherosclerosis (cholesterol build-up, or plaque, in the artery walls). Lifestyle interventions such as dietary choices and physical activity address the cause of atherosclerosis. A review of the types of fats and their impact on blood cholesterol levels, along with dietary  recommendations to reduce fat intake is also included.  Nutrition Action Plan  Clinical staff conducted group or individual video education with verbal and written material and guidebook.  Patient learns how to incorporate Pritikin recommendations into their lifestyle. Recommendations include planning and keeping personal health goals in mind as an important part of their success.  Healthy Mind-Set    Healthy Minds, Bodies, Hearts  Clinical staff conducted group or individual video education with verbal and written material and guidebook.  Patient  learns how to identify when they are stressed. Video will discuss the impact of that stress, as well as the many benefits of stress management. Patient will also be introduced to stress management techniques. The way we think, act, and feel has an impact on our hearts.  How Our Thoughts Can Heal Our Hearts  Clinical staff conducted group or individual video education with verbal and written material and guidebook.  Patient learns that negative thoughts can cause depression and anxiety. This can result in negative lifestyle behavior and serious health problems. Cognitive behavioral therapy is an effective method to help control our thoughts in order to change and improve our emotional outlook.  Additional Videos:  Exercise    Improving Performance  Clinical staff conducted group or individual video education with verbal and written material and guidebook.  Patient learns to use a non-linear approach by alternating intensity levels and lengths of time spent exercising to help burn more calories and lose more body fat. Cardiovascular exercise helps improve heart health, metabolism, hormonal balance, blood sugar control, and recovery from fatigue. Resistance training improves strength, endurance, balance, coordination, reaction time, metabolism, and muscle mass. Flexibility exercise improves circulation, posture, and balance. Seek guidance from your physician and exercise physiologist before implementing an exercise routine and learn your capabilities and proper form for all exercise.  Introduction to Yoga  Clinical staff conducted group or individual video education with verbal and written material and guidebook.  Patient learns about yoga, a discipline of the coming together of mind, breath, and body. The benefits of yoga include improved flexibility, improved range of motion, better posture and core strength, increased lung function, weight loss, and positive self-image. Yogas heart health benefits  include lowered blood pressure, healthier heart rate, decreased cholesterol and triglyceride levels, improved immune function, and reduced stress. Seek guidance from your physician and exercise physiologist before implementing an exercise routine and learn your capabilities and proper form for all exercise.  Medical   Aging: Enhancing Your Quality of Life  Clinical staff conducted group or individual video education with verbal and written material and guidebook.  Patient learns key strategies and recommendations to stay in good physical health and enhance quality of life, such as prevention strategies, having an advocate, securing a Health Care Proxy and Power of Attorney, and keeping a list of medications and system for tracking them. It also discusses how to avoid risk for bone loss.  Biology of Weight Control  Clinical staff conducted group or individual video education with verbal and written material and guidebook.  Patient learns that weight gain occurs because we consume more calories than we burn (eating more, moving less). Even if your body weight is normal, you may have higher ratios of fat compared to muscle mass. Too much body fat puts you at increased risk for cardiovascular disease, heart attack, stroke, type 2 diabetes, and obesity-related cancers. In addition to exercise, following the Pritikin Eating Plan can help reduce your risk.  Decoding Lab Results  Clinical staff conducted  group or individual video education with verbal and written material and guidebook.  Patient learns that lab test reflects one measurement whose values change over time and are influenced by many factors, including medication, stress, sleep, exercise, food, hydration, pre-existing medical conditions, and more. It is recommended to use the knowledge from this video to become more involved with your lab results and evaluate your numbers to speak with your doctor.   Diseases of Our Time - Overview  Clinical  staff conducted group or individual video education with verbal and written material and guidebook.  Patient learns that according to the CDC, 50% to 70% of chronic diseases (such as obesity, type 2 diabetes, elevated lipids, hypertension, and heart disease) are avoidable through lifestyle improvements including healthier food choices, listening to satiety cues, and increased physical activity.  Sleep Disorders Clinical staff conducted group or individual video education with verbal and written material and guidebook.  Patient learns how good quality and duration of sleep are important to overall health and well-being. Patient also learns about sleep disorders and how they impact health along with recommendations to address them, including discussing with a physician.  Nutrition  Dining Out - Part 2 Clinical staff conducted group or individual video education with verbal and written material and guidebook.  Patient learns how to plan ahead and communicate in order to maximize their dining experience in a healthy and nutritious manner. Included are recommended food choices based on the type of restaurant the patient is visiting.   Fueling a Banker conducted group or individual video education with verbal and written material and guidebook.  There is a strong connection between our food choices and our health. Diseases like obesity and type 2 diabetes are very prevalent and are in large-part due to lifestyle choices. The Pritikin Eating Plan provides plenty of food and hunger-curbing satisfaction. It is easy to follow, affordable, and helps reduce health risks.  Menu Workshop  Clinical staff conducted group or individual video education with verbal and written material and guidebook.  Patient learns that restaurant meals can sabotage health goals because they are often packed with calories, fat, sodium, and sugar. Recommendations include strategies to plan ahead and to  communicate with the manager, chef, or server to help order a healthier meal.  Planning Your Eating Strategy  Clinical staff conducted group or individual video education with verbal and written material and guidebook.  Patient learns about the Pritikin Eating Plan and its benefit of reducing the risk of disease. The Pritikin Eating Plan does not focus on calories. Instead, it emphasizes high-quality, nutrient-rich foods. By knowing the characteristics of the foods, we choose, we can determine their calorie density and make informed decisions.  Targeting Your Nutrition Priorities  Clinical staff conducted group or individual video education with verbal and written material and guidebook.  Patient learns that lifestyle habits have a tremendous impact on disease risk and progression. This video provides eating and physical activity recommendations based on your personal health goals, such as reducing LDL cholesterol, losing weight, preventing or controlling type 2 diabetes, and reducing high blood pressure.  Vitamins and Minerals  Clinical staff conducted group or individual video education with verbal and written material and guidebook.  Patient learns different ways to obtain key vitamins and minerals, including through a recommended healthy diet. It is important to discuss all supplements you take with your doctor.   Healthy Mind-Set    Smoking Cessation  Clinical staff conducted group or individual video education  with verbal and written material and guidebook.  Patient learns that cigarette smoking and tobacco addiction pose a serious health risk which affects millions of people. Stopping smoking will significantly reduce the risk of heart disease, lung disease, and many forms of cancer. Recommended strategies for quitting are covered, including working with your doctor to develop a successful plan.  Culinary   Becoming a Set Designer conducted group or individual video  education with verbal and written material and guidebook.  Patient learns that cooking at home can be healthy, cost-effective, quick, and puts them in control. Keys to cooking healthy recipes will include looking at your recipe, assessing your equipment needs, planning ahead, making it simple, choosing cost-effective seasonal ingredients, and limiting the use of added fats, salts, and sugars.  Cooking - Breakfast and Snacks  Clinical staff conducted group or individual video education with verbal and written material and guidebook.  Patient learns how important breakfast is to satiety and nutrition through the entire day. Recommendations include key foods to eat during breakfast to help stabilize blood sugar levels and to prevent overeating at meals later in the day. Planning ahead is also a key component.  Cooking - Educational Psychologist conducted group or individual video education with verbal and written material and guidebook.  Patient learns eating strategies to improve overall health, including an approach to cook more at home. Recommendations include thinking of animal protein as a side on your plate rather than center stage and focusing instead on lower calorie dense options like vegetables, fruits, whole grains, and plant-based proteins, such as beans. Making sauces in large quantities to freeze for later and leaving the skin on your vegetables are also recommended to maximize your experience.  Cooking - Healthy Salads and Dressing Clinical staff conducted group or individual video education with verbal and written material and guidebook.  Patient learns that vegetables, fruits, whole grains, and legumes are the foundations of the Pritikin Eating Plan. Recommendations include how to incorporate each of these in flavorful and healthy salads, and how to create homemade salad dressings. Proper handling of ingredients is also covered. Cooking - Soups and [employer] - Soups and  Desserts Clinical staff conducted group or individual video education with verbal and written material and guidebook.  Patient learns that Pritikin soups and desserts make for easy, nutritious, and delicious snacks and meal components that are low in sodium, fat, sugar, and calorie density, while high in vitamins, minerals, and filling fiber. Recommendations include simple and healthy ideas for soups and desserts.   Overview     The Pritikin Solution Program Overview Clinical staff conducted group or individual video education with verbal and written material and guidebook.  Patient learns that the results of the Pritikin Program have been documented in more than 100 articles published in peer-reviewed journals, and the benefits include reducing risk factors for (and, in some cases, even reversing) high cholesterol, high blood pressure, type 2 diabetes, obesity, and more! An overview of the three key pillars of the Pritikin Program will be covered: eating well, doing regular exercise, and having a healthy mind-set.  WORKSHOPS  Exercise: Exercise Basics: Building Your Action Plan Clinical staff led group instruction and group discussion with PowerPoint presentation and patient guidebook. To enhance the learning environment the use of posters, models and videos may be added. At the conclusion of this workshop, patients will comprehend the difference between physical activity and exercise, as well as the benefits of incorporating  both, into their routine. Patients will understand the FITT (Frequency, Intensity, Time, and Type) principle and how to use it to build an exercise action plan. In addition, safety concerns and other considerations for exercise and cardiac rehab will be addressed by the presenter. The purpose of this lesson is to promote a comprehensive and effective weekly exercise routine in order to improve patients overall level of fitness.   Managing Heart Disease: Your Path to a  Healthier Heart Clinical staff led group instruction and group discussion with PowerPoint presentation and patient guidebook. To enhance the learning environment the use of posters, models and videos may be added.At the conclusion of this workshop, patients will understand the anatomy and physiology of the heart. Additionally, they will understand how Pritikins three pillars impact the risk factors, the progression, and the management of heart disease.  The purpose of this lesson is to provide a high-level overview of the heart, heart disease, and how the Pritikin lifestyle positively impacts risk factors.  Exercise Biomechanics Clinical staff led group instruction and group discussion with PowerPoint presentation and patient guidebook. To enhance the learning environment the use of posters, models and videos may be added. Patients will learn how the structural parts of their bodies function and how these functions impact their daily activities, movement, and exercise. Patients will learn how to promote a neutral spine, learn how to manage pain, and identify ways to improve their physical movement in order to promote healthy living. The purpose of this lesson is to expose patients to common physical limitations that impact physical activity. Participants will learn practical ways to adapt and manage aches and pains, and to minimize their effect on regular exercise. Patients will learn how to maintain good posture while sitting, walking, and lifting.  Balance Training and Fall Prevention  Clinical staff led group instruction and group discussion with PowerPoint presentation and patient guidebook. To enhance the learning environment the use of posters, models and videos may be added. At the conclusion of this workshop, patients will understand the importance of their sensorimotor skills (vision, proprioception, and the vestibular system) in maintaining their ability to balance as they age.  Patients will apply a variety of balancing exercises that are appropriate for their current level of function. Patients will understand the common causes for poor balance, possible solutions to these problems, and ways to modify their physical environment in order to minimize their fall risk. The purpose of this lesson is to teach patients about the importance of maintaining balance as they age and ways to minimize their risk of falling.  WORKSHOPS   Nutrition:  Fueling a Ship Broker led group instruction and group discussion with PowerPoint presentation and patient guidebook. To enhance the learning environment the use of posters, models and videos may be added. Patients will review the foundational principles of the Pritikin Eating Plan and understand what constitutes a serving size in each of the food groups. Patients will also learn Pritikin-friendly foods that are better choices when away from home and review make-ahead meal and snack options. Calorie density will be reviewed and applied to three nutrition priorities: weight maintenance, weight loss, and weight gain. The purpose of this lesson is to reinforce (in a group setting) the key concepts around what patients are recommended to eat and how to apply these guidelines when away from home by planning and selecting Pritikin-friendly options. Patients will understand how calorie density may be adjusted for different weight management goals.  Mindful Eating  Clinical staff  led group instruction and group discussion with PowerPoint presentation and patient guidebook. To enhance the learning environment the use of posters, models and videos may be added. Patients will briefly review the concepts of the Pritikin Eating Plan and the importance of low-calorie dense foods. The concept of mindful eating will be introduced as well as the importance of paying attention to internal hunger signals. Triggers for non-hunger eating and techniques  for dealing with triggers will be explored. The purpose of this lesson is to provide patients with the opportunity to review the basic principles of the Pritikin Eating Plan, discuss the value of eating mindfully and how to measure internal cues of hunger and fullness using the Hunger Scale. Patients will also discuss reasons for non-hunger eating and learn strategies to use for controlling emotional eating.  Targeting Your Nutrition Priorities Clinical staff led group instruction and group discussion with PowerPoint presentation and patient guidebook. To enhance the learning environment the use of posters, models and videos may be added. Patients will learn how to determine their genetic susceptibility to disease by reviewing their family history. Patients will gain insight into the importance of diet as part of an overall healthy lifestyle in mitigating the impact of genetics and other environmental insults. The purpose of this lesson is to provide patients with the opportunity to assess their personal nutrition priorities by looking at their family history, their own health history and current risk factors. Patients will also be able to discuss ways of prioritizing and modifying the Pritikin Eating Plan for their highest risk areas  Menu  Clinical staff led group instruction and group discussion with PowerPoint presentation and patient guidebook. To enhance the learning environment the use of posters, models and videos may be added. Using menus brought in from [employer], or printed from [employer], patients will apply the Pritikin dining out guidelines that were presented in the [employer] video. Patients will also be able to practice these guidelines in a variety of provided scenarios. The purpose of this lesson is to provide patients with the opportunity to practice hands-on learning of the Pritikin Dining Out guidelines with actual menus and practice scenarios.  Label  Reading Clinical staff led group instruction and group discussion with PowerPoint presentation and patient guidebook. To enhance the learning environment the use of posters, models and videos may be added. Patients will review and discuss the Pritikin label reading guidelines presented in Pritikins Label Reading Educational series video. Using fool labels brought in from local grocery stores and markets, patients will apply the label reading guidelines and determine if the packaged food meet the Pritikin guidelines. The purpose of this lesson is to provide patients with the opportunity to review, discuss, and practice hands-on learning of the Pritikin Label Reading guidelines with actual packaged food labels. Cooking School  Pritikins [employer] are designed to teach patients ways to prepare quick, simple, and affordable recipes at home. The importance of nutritions role in chronic disease risk reduction is reflected in its emphasis in the overall Pritikin program. By learning how to prepare essential core Pritikin Eating Plan recipes, patients will increase control over what they eat; be able to customize the flavor of foods without the use of added salt, sugar, or fat; and improve the quality of the food they consume. By learning a set of core recipes which are easily assembled, quickly prepared, and affordable, patients are more likely to prepare more healthy foods at home. These workshops focus on convenient breakfasts,  simple entres, side dishes, and desserts which can be prepared with minimal effort and are consistent with nutrition recommendations for cardiovascular risk reduction. Cooking [employer] are taught by a armed forces logistics/support/administrative officer (RD) who has been trained by the [employer]. The chef or RD has a clear understanding of the importance of minimizing - if not completely eliminating - added fat, sugar, and sodium in recipes. Throughout the series of Cooking  School Workshop sessions, patients will learn about healthy ingredients and efficient methods of cooking to build confidence in their capability to prepare    Cooking School weekly topics:  Adding Flavor- Sodium-Free  Fast and Healthy Breakfasts  Powerhouse Plant-Based Proteins  Satisfying Salads and Dressings  Simple Sides and Sauces  International Cuisine-Spotlight on the [employer] Zones  Delicious Desserts  Savory Soups  [employer] - Meals in a Astronomer Appetizers and Snacks  Comforting Weekend Breakfasts  One-Pot Wonders   Fast Evening Meals  Landscape Architect Your Pritikin Plate  WORKSHOPS   Healthy Mindset (Psychosocial):  Focused Goals, Sustainable Changes Clinical staff led group instruction and group discussion with PowerPoint presentation and patient guidebook. To enhance the learning environment the use of posters, models and videos may be added. Patients will be able to apply effective goal setting strategies to establish at least one personal goal, and then take consistent, meaningful action toward that goal. They will learn to identify common barriers to achieving personal goals and develop strategies to overcome them. Patients will also gain an understanding of how our mind-set can impact our ability to achieve goals and the importance of cultivating a positive and growth-oriented mind-set. The purpose of this lesson is to provide patients with a deeper understanding of how to set and achieve personal goals, as well as the tools and strategies needed to overcome common obstacles which may arise along the way.  From Head to Heart: The Power of a Healthy Outlook  Clinical staff led group instruction and group discussion with PowerPoint presentation and patient guidebook. To enhance the learning environment the use of posters, models and videos may be added. Patients will be able to recognize and describe the impact of emotions and mood on physical  health. They will discover the importance of self-care and explore self-care practices which may work for them. Patients will also learn how to utilize the 4 Cs to cultivate a healthier outlook and better manage stress and challenges. The purpose of this lesson is to demonstrate to patients how a healthy outlook is an essential part of maintaining good health, especially as they continue their cardiac rehab journey.  Healthy Sleep for a Healthy Heart Clinical staff led group instruction and group discussion with PowerPoint presentation and patient guidebook. To enhance the learning environment the use of posters, models and videos may be added. At the conclusion of this workshop, patients will be able to demonstrate knowledge of the importance of sleep to overall health, well-being, and quality of life. They will understand the symptoms of, and treatments for, common sleep disorders. Patients will also be able to identify daytime and nighttime behaviors which impact sleep, and they will be able to apply these tools to help manage sleep-related challenges. The purpose of this lesson is to provide patients with a general overview of sleep and outline the importance of quality sleep. Patients will learn about a few of the most common sleep disorders. Patients will also be introduced to the concept of sleep hygiene, and discover  ways to self-manage certain sleeping problems through simple daily behavior changes. Finally, the workshop will motivate patients by clarifying the links between quality sleep and their goals of heart-healthy living.   Recognizing and Reducing Stress Clinical staff led group instruction and group discussion with PowerPoint presentation and patient guidebook. To enhance the learning environment the use of posters, models and videos may be added. At the conclusion of this workshop, patients will be able to understand the types of stress reactions, differentiate between acute and chronic  stress, and recognize the impact that chronic stress has on their health. They will also be able to apply different coping mechanisms, such as reframing negative self-talk. Patients will have the opportunity to practice a variety of stress management techniques, such as deep abdominal breathing, progressive muscle relaxation, and/or guided imagery.  The purpose of this lesson is to educate patients on the role of stress in their lives and to provide healthy techniques for coping with it.  Learning Barriers/Preferences:  Learning Barriers/Preferences - 10/26/24 1556       Learning Barriers/Preferences   Learning Barriers Hearing    Learning Preferences Individual Instruction;Group Instruction;Skilled Demonstration          Education Topics:  Knowledge Questionnaire Score:  Knowledge Questionnaire Score - 10/26/24 1414       Knowledge Questionnaire Score   Pre Score 23/24          Core Components/Risk Factors/Patient Goals at Admission:  Personal Goals and Risk Factors at Admission - 10/26/24 1557       Core Components/Risk Factors/Patient Goals on Admission    Weight Management Yes;Obesity;Weight Loss    Intervention Weight Management: Develop a combined nutrition and exercise program designed to reach desired caloric intake, while maintaining appropriate intake of nutrient and fiber, sodium and fats, and appropriate energy expenditure required for the weight goal.;Weight Management: Provide education and appropriate resources to help participant work on and attain dietary goals.;Weight Management/Obesity: Establish reasonable short term and long term weight goals.;Obesity: Provide education and appropriate resources to help participant work on and attain dietary goals.    Admit Weight 287 lb 11.2 oz (130.5 kg)    Goal Weight: Short Term 275 lb (124.7 kg)    Goal Weight: Long Term 200 lb (90.7 kg)    Expected Outcomes Short Term: Continue to assess and modify interventions until  short term weight is achieved;Long Term: Adherence to nutrition and physical activity/exercise program aimed toward attainment of established weight goal;Weight Loss: Understanding of general recommendations for a balanced deficit meal plan, which promotes 1-2 lb weight loss per week and includes a negative energy balance of [phone] kcal/d;Understanding recommendations for meals to include 15-35% energy as protein, 25-35% energy from fat, 35-60% energy from carbohydrates, less than 200mg  of dietary cholesterol, 20-35 gm of total fiber daily;Understanding of distribution of calorie intake throughout the day with the consumption of 4-5 meals/snacks    Diabetes Yes    Intervention Provide education about signs/symptoms and action to take for hypo/hyperglycemia.;Provide education about proper nutrition, including hydration, and aerobic/resistive exercise prescription along with prescribed medications to achieve blood glucose in normal ranges: Fasting glucose 65-99 mg/dL    Expected Outcomes Short Term: Participant verbalizes understanding of the signs/symptoms and immediate care of hyper/hypoglycemia, proper foot care and importance of medication, aerobic/resistive exercise and nutrition plan for blood glucose control.;Long Term: Attainment of HbA1C < 7%.    Hypertension Yes    Intervention Provide education on lifestyle modifcations including regular physical activity/exercise, weight management, moderate  sodium restriction and increased consumption of fresh fruit, vegetables, and low fat dairy, alcohol moderation, and smoking cessation.;Monitor prescription use compliance.    Expected Outcomes Short Term: Continued assessment and intervention until BP is < 140/33mm HG in hypertensive participants. < 130/41mm HG in hypertensive participants with diabetes, heart failure or chronic kidney disease.;Long Term: Maintenance of blood pressure at goal levels.    Lipids Yes    Intervention Provide education and support  for participant on nutrition & aerobic/resistive exercise along with prescribed medications to achieve LDL 70mg , HDL >40mg .    Expected Outcomes Short Term: Participant states understanding of desired cholesterol values and is compliant with medications prescribed. Participant is following exercise prescription and nutrition guidelines.;Long Term: Cholesterol controlled with medications as prescribed, with individualized exercise RX and with personalized nutrition plan. Value goals: LDL < 70mg , HDL > 40 mg.    Stress Yes    Intervention Offer individual and/or small group education and counseling on adjustment to heart disease, stress management and health-related lifestyle change. Teach and support self-help strategies.;Refer participants experiencing significant psychosocial distress to appropriate mental health specialists for further evaluation and treatment. When possible, include family members and significant others in education/counseling sessions.    Expected Outcomes Short Term: Participant demonstrates changes in health-related behavior, relaxation and other stress management skills, ability to obtain effective social support, and compliance with psychotropic medications if prescribed.;Long Term: Emotional wellbeing is indicated by absence of clinically significant psychosocial distress or social isolation.          Core Components/Risk Factors/Patient Goals Review:    Core Components/Risk Factors/Patient Goals at Discharge (Final Review):    ITP Comments:  ITP Comments     Row Name 10/26/24 1304           ITP Comments Wilbert Holland, MD: Medical Director.  Introduction to the [facility] education [facility]/Intensive Cardiac Rehab.  Initial orientation packet reviewed with the patient.          Comments:Henryk  started cardiac rehab today.  Pt tolerated light exercise without difficulty. VSS, telemetry-Sinus Rhythm,Bundle Branch Block, asymptomatic.  Medication list reconciled. Pt  denies barriers to medicaiton compliance.  PSYCHOSOCIAL ASSESSMENT:  PHQ-9. Pt exhibits positive coping skills, hopeful outlook with supportive family. No psychosocial needs identified at this time, no psychosocial interventions necessary.    Pt enjoys bowling. Lucille is somewhat deconditioned.  Pt oriented to exercise equipment and routine.    Understanding verbalized.     [1]  Current Outpatient Medications:    Accu-Chek FastClix Lancets MISC, Check blood sugar three times daily, Disp: 300 each, Rfl: 1   amLODipine (NORVASC) 10 MG tablet, Take 10 mg by mouth daily., Disp: , Rfl:    aspirin EC 81 MG tablet, Take 81 mg by mouth daily. Swallow whole., Disp: , Rfl:    Blood Glucose Monitoring Suppl (ACCU-CHEK GUIDE) w/Device KIT, 1 Device by Does not apply route 3 (three) times daily., Disp: 1 kit, Rfl: 0   cetirizine (ZYRTEC) 10 MG tablet, Take 10 mg by mouth daily., Disp: , Rfl:    Continuous Glucose Sensor (DEXCOM G7 SENSOR) MISC, 1 Device by Does not apply route as directed. Change sensor every 10 days, Disp: 9 each, Rfl: 3   fenofibrate  micronized (LOFIBRA) 200 MG capsule, Take 1 capsule (200 mg total) by mouth daily before breakfast., Disp: 90 capsule, Rfl: 3   glucose blood (ACCU-CHEK GUIDE TEST) test strip, 1 each by Other route 3 (three) times daily. Use as instructed, Disp: 300 each, Rfl: 12  HYDROcodone-acetaminophen (NORCO) 7.5-325 MG tablet, Take 1 tablet by mouth 3 (three) times daily., Disp: , Rfl:    ibuprofen (ADVIL) 600 MG tablet, Take 600 mg by mouth every 8 (eight) hours as needed (pain)., Disp: , Rfl:    insulin  glargine, 2 Unit Dial, (TOUJEO  MAX SOLOSTAR) 300 UNIT/ML Solostar Pen, Inject 100 Units into the skin daily in the afternoon., Disp: 15 mL, Rfl: 3   insulin  lispro (HUMALOG  KWIKPEN) 200 UNIT/ML KwikPen, Inject 30 Units into the skin 3 (three) times daily., Disp: 15 mL, Rfl: 4   Insulin  Pen Needle 32G X 4 MM MISC, 1 Device by Does not apply route in the morning, at noon, in  the evening, and at bedtime., Disp: 400 each, Rfl: 3   isosorbide  mononitrate (IMDUR ) 30 MG 24 hr tablet, Take 1 tablet (30 mg total) by mouth daily., Disp: 90 tablet, Rfl: 3   Levothyroxine Sodium (SYNTHROID PO), Take 50 mcg by mouth daily., Disp: , Rfl:    lisinopril-hydrochlorothiazide (ZESTORETIC) 20-25 MG tablet, Take 1 tablet by mouth daily., Disp: , Rfl:    metoprolol succinate (TOPROL-XL) 50 MG 24 hr tablet, Take 50 mg by mouth daily., Disp: , Rfl:    nitroGLYCERIN  (NITROSTAT ) 0.4 MG SL tablet, Place 1 tablet (0.4 mg total) under the tongue every 5 (five) minutes as needed for chest pain., Disp: 25 tablet, Rfl: 3   rosuvastatin  (CRESTOR ) 20 MG tablet, Take 1 tablet (20 mg total) by mouth daily., Disp: 90 tablet, Rfl: 3   sertraline (ZOLOFT) 50 MG tablet, Take 50 mg by mouth daily., Disp: , Rfl:  [2]  Social History Tobacco Use  Smoking Status Never   Passive exposure: Current  Smokeless Tobacco Never

## 2024-11-02 NOTE — Progress Notes (Signed)
 Cardiac Individual Treatment Plan  Patient Details  Name: Ronald Bauer MRN: [id number] Date of Birth: May 03, 1961 Referring Provider:   Flowsheet Row INTENSIVE CARDIAC REHAB ORIENT from 10/26/2024 in [facility]  Referring Provider Shelda Bruckner, MD    Initial Encounter Date:  Flowsheet Row INTENSIVE CARDIAC REHAB ORIENT from 10/26/2024 in [facility]  Date 10/26/24    Visit Diagnosis: Stable angina  Patient's Home Medications on Admission: Current Medications[1]  Past Medical History: Past Medical History:  Diagnosis Date   Diabetes mellitus without complication (HCC)    Hypertension    Thyroid disease     Tobacco Use: Tobacco Use History[2]  Labs: Review Flowsheet        No data to display          Capillary Blood Glucose: Lab Results  Component Value Date   GLUCAP 261 (H) 11/01/2024   GLUCAP 148 (H) 11/01/2024     Exercise Target Goals: Exercise Program Goal: Individual exercise prescription set using results from initial 6 min walk test and THRR while considering  patients activity barriers and safety.   Exercise Prescription Goal: Initial exercise prescription builds to 30-45 minutes a day of aerobic activity, 2-3 days per week.  Home exercise guidelines will be given to patient during program as part of exercise prescription that the participant will acknowledge.  Activity Barriers & Risk Stratification:  Activity Barriers & Cardiac Risk Stratification - 10/26/24 1416       Activity Barriers & Cardiac Risk Stratification   Activity Barriers Arthritis;Deconditioning;Muscular Weakness;Balance Concerns    Cardiac Risk Stratification High          6 Minute Walk:  6 Minute Walk     Row Name 10/26/24 1516         6 Minute Walk   Phase Initial     Distance 850 feet     Walk Time 6 minutes     # of Rest Breaks 1   4:15-5:12 due to chronic rt hip pain     MPH 1.61     METS 1.63     RPE 11     Perceived Dyspnea  0     VO2 Peak 5.7     Symptoms Yes (comment)     Comments Chronic rt hip pain 6/10, chronic left ankle pain 4/10     Resting HR 63 bpm     Resting BP 142/50     Resting Oxygen Saturation  97 %     Exercise Oxygen Saturation  during 6 min walk 96 %     Max Ex. HR 84 bpm     Max Ex. BP 156/68     2 Minute Post BP 140/64        Oxygen Initial Assessment:   Oxygen Re-Evaluation:   Oxygen Discharge (Final Oxygen Re-Evaluation):   Initial Exercise Prescription:  Initial Exercise Prescription - 10/26/24 1400       Date of Initial Exercise RX and Referring Provider   Date 10/26/24    Referring Provider Shelda Bruckner, MD    Expected Discharge Date 01/19/25      NuStep   Level 1    SPM 75    Minutes 15    METs 1.6      Arm Ergometer   Level 1    Watts 25    RPM 60    Minutes 15  METs 1.6      Prescription Details   Frequency (times per week) 3    Duration Progress to 30 minutes of continuous aerobic without signs/symptoms of physical distress      Intensity   THRR 40-80% of Max Heartrate 62-124    Ratings of Perceived Exertion 11-13    Perceived Dyspnea 0-4      Progression   Progression Continue progressive overload as per policy without signs/symptoms or physical distress.      Resistance Training   Training Prescription Yes    Reps 10-15          Perform Capillary Blood Glucose checks as needed.  Exercise Prescription Changes:   Exercise Prescription Changes     Row Name 11/01/24 1500             Response to Exercise   Blood Pressure (Admit) 130/64       Blood Pressure (Exercise) 162/74       Blood Pressure (Exit) 124/78       Heart Rate (Admit) 57 bpm       Heart Rate (Exercise) 104 bpm       Heart Rate (Exit) 66 bpm       Rating of Perceived Exertion (Exercise) 15       Symptoms None       Comments Pt's first day in the CRP2  program       Duration Continue with 30 min of aerobic exercise without signs/symptoms of physical distress.       Intensity THRR unchanged         Progression   Progression Continue to progress workloads to maintain intensity without signs/symptoms of physical distress.       Average METs 1.7         Resistance Training   Weight 3 lbs       Reps 10-15       Time 5 Minutes         Interval Training   Interval Training No         NuStep   Level 1       SPM 74       Minutes 15       METs 1.9         Arm Ergometer   Level 1       Watts 14       RPM 58       Minutes 15       METs 1.5          Exercise Comments:   Exercise Comments     Row Name 11/01/24 1427           Exercise Comments Pt's firs tday in the program. Pt tolerated session well and exercised without complaints.          Exercise Goals and Review:   Exercise Goals     Row Name 10/26/24 1414             Exercise Goals   Increase Physical Activity Yes       Intervention Provide advice, education, support and counseling about physical activity/exercise needs.;Develop an individualized exercise prescription for aerobic and resistive training based on initial evaluation findings, risk stratification, comorbidities and participant's personal goals.       Expected Outcomes Short Term: Attend rehab on a regular basis to increase amount of physical activity.;Long Term: Exercising regularly at least 3-5 days a week.;Long Term: Add in home exercise to make  exercise part of routine and to increase amount of physical activity.       Increase Strength and Stamina Yes       Intervention Provide advice, education, support and counseling about physical activity/exercise needs.;Develop an individualized exercise prescription for aerobic and resistive training based on initial evaluation findings, risk stratification, comorbidities and participant's personal goals.       Expected Outcomes Short Term: Increase  workloads from initial exercise prescription for resistance, speed, and METs.;Short Term: Perform resistance training exercises routinely during rehab and add in resistance training at home;Long Term: Improve cardiorespiratory fitness, muscular endurance and strength as measured by increased METs and functional capacity ( )       Able to understand and use rate of perceived exertion (RPE) scale Yes       Intervention Provide education and explanation on how to use RPE scale       Expected Outcomes Short Term: Able to use RPE daily in rehab to express subjective intensity level;Long Term:  Able to use RPE to guide intensity level when exercising independently       Knowledge and understanding of Target Heart Rate Range (THRR) Yes       Intervention Provide education and explanation of THRR including how the numbers were predicted and where they are located for reference       Expected Outcomes Short Term: Able to state/look up THRR;Long Term: Able to use THRR to govern intensity when exercising independently;Short Term: Able to use daily as guideline for intensity in rehab       Understanding of Exercise Prescription Yes       Intervention Provide education, explanation, and written materials on patient's individual exercise prescription       Expected Outcomes Short Term: Able to explain program exercise prescription;Long Term: Able to explain home exercise prescription to exercise independently          Exercise Goals Re-Evaluation :  Exercise Goals Re-Evaluation     Row Name 11/01/24 1426             Exercise Goal Re-Evaluation   Exercise Goals Review Increase Physical Activity;Increase Strength and Stamina;Able to understand and use rate of perceived exertion (RPE) scale;Knowledge and understanding of Target Heart Rate Range (THRR);Understanding of Exercise Prescription       Comments Pt's first day in the CRP2 program. Pt understands the exercisr Rx, RPE sclae anf THRR.       Expected  Outcomes Will continue to monitor the patient and progress exercise workloads as tolerated.          Discharge Exercise Prescription (Final Exercise Prescription Changes):  Exercise Prescription Changes - 11/01/24 1500       Response to Exercise   Blood Pressure (Admit) 130/64    Blood Pressure (Exercise) 162/74    Blood Pressure (Exit) 124/78    Heart Rate (Admit) 57 bpm    Heart Rate (Exercise) 104 bpm    Heart Rate (Exit) 66 bpm    Rating of Perceived Exertion (Exercise) 15    Symptoms None    Comments Pt's first day in the CRP2 program    Duration Continue with 30 min of aerobic exercise without signs/symptoms of physical distress.    Intensity THRR unchanged      Progression   Progression Continue to progress workloads to maintain intensity without signs/symptoms of physical distress.    Average METs 1.7      Resistance Training   Weight 3 lbs    Reps  10-15    Time 5 Minutes      Interval Training   Interval Training No      NuStep   Level 1    SPM 74    Minutes 15    METs 1.9      Arm Ergometer   Level 1    Watts 14    RPM 58    Minutes 15    METs 1.5          Nutrition:  Target Goals: Understanding of nutrition guidelines, daily intake of sodium 1500mg , cholesterol 200mg , calories 30% from fat and 7% or less from saturated fats, daily to have 5 or more servings of fruits and vegetables.  Biometrics:  Pre Biometrics - 10/26/24 1330       Pre Biometrics   Waist Circumference 55 inches    Hip Circumference 47.5 inches    Waist to Hip Ratio 1.16 %    Triceps Skinfold 11 mm    % Body Fat 37.5 %    Grip Strength 38 kg    Flexibility --   Not performedd back issues   Single Leg Stand 6.8 seconds           Nutrition Therapy Plan and Nutrition Goals:  Nutrition Therapy & Goals - 11/01/24 1345       Nutrition Therapy   Diet Heart Healthy      Personal Nutrition Goals   Nutrition Goal Patient to identify strategies for reducing  cardiovascular risk by attending the Pritikin education and nutrition series weekly.    Personal Goal #2 Patient to improve diet quality by using the plate method as a guide for meal planning to include lean protein/plant protein, fruits, vegetables, whole grains, nonfat dairy as part of a well-balanced diet.    Personal Goal #3 Patient to limit sodium intake to <2300 mg per day.    Comments Patient with medical history significant for abnormal stress test consistent with CAD, stable angina, hypertension, type II diabetes, dyslipidemia, obesity. Unhealthy eating pattern based on PYP score < 40. Pt reports difficulty making dietary changes due to food preferences of family members; also reports getting most meals from restaurants. RD provided suggestions for increasing intake of fruit/vegetables as well as whole grains when eating out. Also discussed easy-to-prepare foods such as frozen veggies, canned tuna. RD encouraged pt to prepare meals at home when possible to help reduce sodium intake. Patient will benefit from participation in intensive cardiac rehab for nutrition education, exercise, and lifestyle modification.      Intervention Plan   Intervention Prescribe, educate and counsel regarding individualized specific dietary modifications aiming towards targeted core components such as weight, hypertension, lipid management, diabetes, heart failure and other comorbidities.;Nutrition handout(s) given to patient.   Handout: My Plate Planner   Expected Outcomes Short Term Goal: Understand basic principles of dietary content, such as calories, fat, sodium, cholesterol and nutrients.;Long Term Goal: Adherence to prescribed nutrition plan.          Nutrition Assessments:  MEDIFICTS Score Key: >=70 Need to make dietary changes  40-70 Heart Healthy Diet <= 40 Therapeutic Level Cholesterol Diet   Flowsheet Row INTENSIVE CARDIAC REHAB from 11/01/2024 in [facility]  Picture Your Plate Total Score on Admission 38   Picture Your Plate Scores: <59 Unhealthy dietary pattern with much room for improvement. 41-50 Dietary pattern unlikely to meet recommendations for good health and room for improvement. 51-60 More healthful  dietary pattern, with some room for improvement.  >60 Healthy dietary pattern, although there may be some specific behaviors that could be improved.    Nutrition Goals Re-Evaluation:   Nutrition Goals Re-Evaluation:   Nutrition Goals Discharge (Final Nutrition Goals Re-Evaluation):   Psychosocial: Target Goals: Acknowledge presence or absence of significant depression and/or stress, maximize coping skills, provide positive support system. Participant is able to verbalize types and ability to use techniques and skills needed for reducing stress and depression.  Initial Review & Psychosocial Screening:  Initial Psych Review & Screening - 10/26/24 1329       Initial Review   Current issues with Current Sleep Concerns;Current Stress Concerns    Source of Stress Concerns Chronic Illness      Family Dynamics   Good Support System? Yes   Pt has wife, brother-in-law, and two best friends as a part of his support system     Barriers   Psychosocial barriers to participate in program The patient should benefit from training in stress management and relaxation.      Screening Interventions   Interventions Encouraged to exercise;To provide support and resources with identified psychosocial needs;Provide feedback about the scores to participant    Expected Outcomes Long Term Goal: Stressors or current issues are controlled or eliminated.;Short Term goal: Identification and review with participant of any Quality of Life or Depression concerns found by scoring the questionnaire.;Long Term goal: The participant improves quality of Life and PHQ9 Scores as seen by post scores and/or verbalization of changes           Quality of Life Scores:  Quality of Life - 10/26/24 1413       Quality of Life   Select Quality of Life      Quality of Life Scores   Health/Function Pre 20.4 %    Socioeconomic Pre 30 %    Psych/Spiritual Pre 30 %    Family Pre 24 %    GLOBAL Pre 24.88 %         Scores of 19 and below usually indicate a poorer quality of life in these areas.  A difference of  2-3 points is a clinically meaningful difference.  A difference of 2-3 points in the total score of the Quality of Life Index has been associated with significant improvement in overall quality of life, self-image, physical symptoms, and general health in studies assessing change in quality of life.  PHQ-9: Review Flowsheet       10/26/2024 09/06/2024  Depression screen PHQ 2/9  Decreased Interest 0 0  Down, Depressed, Hopeless 0 0  PHQ - 2 Score 0 0  Altered sleeping 3 -  Tired, decreased energy 3 -  Change in appetite 3 -  Feeling bad or failure about yourself  0 -  Trouble concentrating 0 -  Moving slowly or fidgety/restless 0 -  Suicidal thoughts 0 -  PHQ-9 Score 9 -  Difficult doing work/chores Not difficult at all -   Interpretation of Total Score  Total Score Depression Severity:  1-4 = Minimal depression, 5-9 = Mild depression, 10-14 = Moderate depression, 15-19 = Moderately severe depression, 20-27 = Severe depression   Psychosocial Evaluation and Intervention:   Psychosocial Re-Evaluation:  Psychosocial Re-Evaluation     Row Name 11/02/24 [phone]             Psychosocial Re-Evaluation   Current issues with Current Sleep Concerns;Current Stress Concerns       Comments Trayce has not voiced any  additional psychosocial concerns or stressors during exercise at cardiac rehab       Expected Outcomes Nhat will experience controlled or decreased psychosocial concerns or stressors by completion of cardiac rehab       Interventions Stress management education;Encouraged to attend Cardiac  Rehabilitation for the exercise;Relaxation education       Continue Psychosocial Services  Follow up required by staff          Psychosocial Discharge (Final Psychosocial Re-Evaluation):  Psychosocial Re-Evaluation - 11/02/24 0943       Psychosocial Re-Evaluation   Current issues with Current Sleep Concerns;Current Stress Concerns    Comments Candice has not voiced any additional psychosocial concerns or stressors during exercise at cardiac rehab    Expected Outcomes Duc will experience controlled or decreased psychosocial concerns or stressors by completion of cardiac rehab    Interventions Stress management education;Encouraged to attend Cardiac Rehabilitation for the exercise;Relaxation education    Continue Psychosocial Services  Follow up required by staff          Vocational Rehabilitation: Provide vocational rehab assistance to qualifying candidates.   Vocational Rehab Evaluation & Intervention:  Vocational Rehab - 10/26/24 1557       Initial Vocational Rehab Evaluation & Intervention   Assessment shows need for Vocational Rehabilitation No   Pt is retired         Education: Education Goals: Education classes will be provided on a weekly basis, covering required topics. Participant will state understanding/return demonstration of topics presented.    Education     Row Name 11/01/24 1400     Education   Cardiac Education Topics Pritikin   Psychologist, Forensic Psychosocial   Psychosocial How Our Thoughts Can Heal Our Hearts   Instruction Review Code 1- Verbalizes Understanding   Class Start Time 1400   Class Stop Time 1450   Class Time Calculation (min) 50 min      Core Videos: Exercise    Move It!  Clinical staff conducted group or individual video education with verbal and written material and guidebook.  Patient learns the recommended Pritikin exercise program. Exercise with the goal of living  a long, healthy life. Some of the health benefits of exercise include controlled diabetes, healthier blood pressure levels, improved cholesterol levels, improved heart and lung capacity, improved sleep, and better body composition. Everyone should speak with their doctor before starting or changing an exercise routine.  Biomechanical Limitations Clinical staff conducted group or individual video education with verbal and written material and guidebook.  Patient learns how biomechanical limitations can impact exercise and how we can mitigate and possibly overcome limitations to have an impactful and balanced exercise routine.  Body Composition Clinical staff conducted group or individual video education with verbal and written material and guidebook.  Patient learns that body composition (ratio of muscle mass to fat mass) is a key component to assessing overall fitness, rather than body weight alone. Increased fat mass, especially visceral belly fat, can put us  at increased risk for metabolic syndrome, type 2 diabetes, heart disease, and even death. It is recommended to combine diet and exercise (cardiovascular and resistance training) to improve your body composition. Seek guidance from your physician and exercise physiologist before implementing an exercise routine.  Exercise Action Plan Clinical staff conducted group or individual video education with verbal and written material and guidebook.  Patient learns the recommended strategies to achieve and enjoy  long-term exercise adherence, including variety, self-motivation, self-efficacy, and positive decision making. Benefits of exercise include fitness, good health, weight management, more energy, better sleep, less stress, and overall well-being.  Medical   Heart Disease Risk Reduction Clinical staff conducted group or individual video education with verbal and written material and guidebook.  Patient learns our heart is our most vital organ as it  circulates oxygen, nutrients, white blood cells, and hormones throughout the entire body, and carries waste away. Data supports a plant-based eating plan like the Pritikin Program for its effectiveness in slowing progression of and reversing heart disease. The video provides a number of recommendations to address heart disease.   Metabolic Syndrome and Belly Fat  Clinical staff conducted group or individual video education with verbal and written material and guidebook.  Patient learns what metabolic syndrome is, how it leads to heart disease, and how one can reverse it and keep it from coming back. You have metabolic syndrome if you have 3 of the following 5 criteria: abdominal obesity, high blood pressure, high triglycerides, low HDL cholesterol, and high blood sugar.  Hypertension and Heart Disease Clinical staff conducted group or individual video education with verbal and written material and guidebook.  Patient learns that high blood pressure, or hypertension, is very common in the [place] . Hypertension is largely due to excessive salt intake, but other important risk factors include being overweight, physical inactivity, drinking too much alcohol, smoking, and not eating enough potassium from fruits and vegetables. High blood pressure is a leading risk factor for heart attack, stroke, congestive heart failure, dementia, kidney failure, and premature death. Long-term effects of excessive salt intake include stiffening of the arteries and thickening of heart muscle and organ damage. Recommendations include ways to reduce hypertension and the risk of heart disease.  Diseases of Our Time - Focusing on Diabetes Clinical staff conducted group or individual video education with verbal and written material and guidebook.  Patient learns why the best way to stop diseases of our time is prevention, through food and other lifestyle changes. Medicine (such as prescription pills and surgeries) is often  only a Band-Aid on the problem, not a long-term solution. Most common diseases of our time include obesity, type 2 diabetes, hypertension, heart disease, and cancer. The Pritikin Program is recommended and has been proven to help reduce, reverse, and/or prevent the damaging effects of metabolic syndrome.  Nutrition   Overview of the Pritikin Eating Plan  Clinical staff conducted group or individual video education with verbal and written material and guidebook.  Patient learns about the Pritikin Eating Plan for disease risk reduction. The Pritikin Eating Plan emphasizes a wide variety of unrefined, minimally-processed carbohydrates, like fruits, vegetables, whole grains, and legumes. Go, Caution, and Stop food choices are explained. Plant-based and lean animal proteins are emphasized. Rationale provided for low sodium intake for blood pressure control, low added sugars for blood sugar stabilization, and low added fats and oils for coronary artery disease risk reduction and weight management.  Calorie Density  Clinical staff conducted group or individual video education with verbal and written material and guidebook.  Patient learns about calorie density and how it impacts the Pritikin Eating Plan. Knowing the characteristics of the food you choose will help you decide whether those foods will lead to weight gain or weight loss, and whether you want to consume more or less of them. Weight loss is usually a side effect of the Pritikin Eating Plan because of its focus on low calorie-dense  foods.  Label Reading  Clinical staff conducted group or individual video education with verbal and written material and guidebook.  Patient learns about the Pritikin recommended label reading guidelines and corresponding recommendations regarding calorie density, added sugars, sodium content, and whole grains.  Dining Out - Part 1  Clinical staff conducted group or individual video education with verbal and written  material and guidebook.  Patient learns that restaurant meals can be sabotaging because they can be so high in calories, fat, sodium, and/or sugar. Patient learns recommended strategies on how to positively address this and avoid unhealthy pitfalls.  Facts on Fats  Clinical staff conducted group or individual video education with verbal and written material and guidebook.  Patient learns that lifestyle modifications can be just as effective, if not more so, as many medications for lowering your risk of heart disease. A Pritikin lifestyle can help to reduce your risk of inflammation and atherosclerosis (cholesterol build-up, or plaque, in the artery walls). Lifestyle interventions such as dietary choices and physical activity address the cause of atherosclerosis. A review of the types of fats and their impact on blood cholesterol levels, along with dietary recommendations to reduce fat intake is also included.  Nutrition Action Plan  Clinical staff conducted group or individual video education with verbal and written material and guidebook.  Patient learns how to incorporate Pritikin recommendations into their lifestyle. Recommendations include planning and keeping personal health goals in mind as an important part of their success.  Healthy Mind-Set    Healthy Minds, Bodies, Hearts  Clinical staff conducted group or individual video education with verbal and written material and guidebook.  Patient learns how to identify when they are stressed. Video will discuss the impact of that stress, as well as the many benefits of stress management. Patient will also be introduced to stress management techniques. The way we think, act, and feel has an impact on our hearts.  How Our Thoughts Can Heal Our Hearts  Clinical staff conducted group or individual video education with verbal and written material and guidebook.  Patient learns that negative thoughts can cause depression and anxiety. This can result in  negative lifestyle behavior and serious health problems. Cognitive behavioral therapy is an effective method to help control our thoughts in order to change and improve our emotional outlook.  Additional Videos:  Exercise    Improving Performance  Clinical staff conducted group or individual video education with verbal and written material and guidebook.  Patient learns to use a non-linear approach by alternating intensity levels and lengths of time spent exercising to help burn more calories and lose more body fat. Cardiovascular exercise helps improve heart health, metabolism, hormonal balance, blood sugar control, and recovery from fatigue. Resistance training improves strength, endurance, balance, coordination, reaction time, metabolism, and muscle mass. Flexibility exercise improves circulation, posture, and balance. Seek guidance from your physician and exercise physiologist before implementing an exercise routine and learn your capabilities and proper form for all exercise.  Introduction to Yoga  Clinical staff conducted group or individual video education with verbal and written material and guidebook.  Patient learns about yoga, a discipline of the coming together of mind, breath, and body. The benefits of yoga include improved flexibility, improved range of motion, better posture and core strength, increased lung function, weight loss, and positive self-image. Yogas heart health benefits include lowered blood pressure, healthier heart rate, decreased cholesterol and triglyceride levels, improved immune function, and reduced stress. Seek guidance from your physician and exercise physiologist  before implementing an exercise routine and learn your capabilities and proper form for all exercise.  Medical   Aging: Enhancing Your Quality of Life  Clinical staff conducted group or individual video education with verbal and written material and guidebook.  Patient learns key strategies and  recommendations to stay in good physical health and enhance quality of life, such as prevention strategies, having an advocate, securing a Health Care Proxy and Power of Attorney, and keeping a list of medications and system for tracking them. It also discusses how to avoid risk for bone loss.  Biology of Weight Control  Clinical staff conducted group or individual video education with verbal and written material and guidebook.  Patient learns that weight gain occurs because we consume more calories than we burn (eating more, moving less). Even if your body weight is normal, you may have higher ratios of fat compared to muscle mass. Too much body fat puts you at increased risk for cardiovascular disease, heart attack, stroke, type 2 diabetes, and obesity-related cancers. In addition to exercise, following the Pritikin Eating Plan can help reduce your risk.  Decoding Lab Results  Clinical staff conducted group or individual video education with verbal and written material and guidebook.  Patient learns that lab test reflects one measurement whose values change over time and are influenced by many factors, including medication, stress, sleep, exercise, food, hydration, pre-existing medical conditions, and more. It is recommended to use the knowledge from this video to become more involved with your lab results and evaluate your numbers to speak with your doctor.   Diseases of Our Time - Overview  Clinical staff conducted group or individual video education with verbal and written material and guidebook.  Patient learns that according to the CDC, 50% to 70% of chronic diseases (such as obesity, type 2 diabetes, elevated lipids, hypertension, and heart disease) are avoidable through lifestyle improvements including healthier food choices, listening to satiety cues, and increased physical activity.  Sleep Disorders Clinical staff conducted group or individual video education with verbal and written  material and guidebook.  Patient learns how good quality and duration of sleep are important to overall health and well-being. Patient also learns about sleep disorders and how they impact health along with recommendations to address them, including discussing with a physician.  Nutrition  Dining Out - Part 2 Clinical staff conducted group or individual video education with verbal and written material and guidebook.  Patient learns how to plan ahead and communicate in order to maximize their dining experience in a healthy and nutritious manner. Included are recommended food choices based on the type of restaurant the patient is visiting.   Fueling a Banker conducted group or individual video education with verbal and written material and guidebook.  There is a strong connection between our food choices and our health. Diseases like obesity and type 2 diabetes are very prevalent and are in large-part due to lifestyle choices. The Pritikin Eating Plan provides plenty of food and hunger-curbing satisfaction. It is easy to follow, affordable, and helps reduce health risks.  Menu Workshop  Clinical staff conducted group or individual video education with verbal and written material and guidebook.  Patient learns that restaurant meals can sabotage health goals because they are often packed with calories, fat, sodium, and sugar. Recommendations include strategies to plan ahead and to communicate with the manager, chef, or server to help order a healthier meal.  Planning Your Eating Strategy  Clinical staff conducted group  or individual video education with verbal and written material and guidebook.  Patient learns about the Pritikin Eating Plan and its benefit of reducing the risk of disease. The Pritikin Eating Plan does not focus on calories. Instead, it emphasizes high-quality, nutrient-rich foods. By knowing the characteristics of the foods, we choose, we can determine their  calorie density and make informed decisions.  Targeting Your Nutrition Priorities  Clinical staff conducted group or individual video education with verbal and written material and guidebook.  Patient learns that lifestyle habits have a tremendous impact on disease risk and progression. This video provides eating and physical activity recommendations based on your personal health goals, such as reducing LDL cholesterol, losing weight, preventing or controlling type 2 diabetes, and reducing high blood pressure.  Vitamins and Minerals  Clinical staff conducted group or individual video education with verbal and written material and guidebook.  Patient learns different ways to obtain key vitamins and minerals, including through a recommended healthy diet. It is important to discuss all supplements you take with your doctor.   Healthy Mind-Set    Smoking Cessation  Clinical staff conducted group or individual video education with verbal and written material and guidebook.  Patient learns that cigarette smoking and tobacco addiction pose a serious health risk which affects millions of people. Stopping smoking will significantly reduce the risk of heart disease, lung disease, and many forms of cancer. Recommended strategies for quitting are covered, including working with your doctor to develop a successful plan.  Culinary   Becoming a Set Designer conducted group or individual video education with verbal and written material and guidebook.  Patient learns that cooking at home can be healthy, cost-effective, quick, and puts them in control. Keys to cooking healthy recipes will include looking at your recipe, assessing your equipment needs, planning ahead, making it simple, choosing cost-effective seasonal ingredients, and limiting the use of added fats, salts, and sugars.  Cooking - Breakfast and Snacks  Clinical staff conducted group or individual video education with verbal and  written material and guidebook.  Patient learns how important breakfast is to satiety and nutrition through the entire day. Recommendations include key foods to eat during breakfast to help stabilize blood sugar levels and to prevent overeating at meals later in the day. Planning ahead is also a key component.  Cooking - Educational Psychologist conducted group or individual video education with verbal and written material and guidebook.  Patient learns eating strategies to improve overall health, including an approach to cook more at home. Recommendations include thinking of animal protein as a side on your plate rather than center stage and focusing instead on lower calorie dense options like vegetables, fruits, whole grains, and plant-based proteins, such as beans. Making sauces in large quantities to freeze for later and leaving the skin on your vegetables are also recommended to maximize your experience.  Cooking - Healthy Salads and Dressing Clinical staff conducted group or individual video education with verbal and written material and guidebook.  Patient learns that vegetables, fruits, whole grains, and legumes are the foundations of the Pritikin Eating Plan. Recommendations include how to incorporate each of these in flavorful and healthy salads, and how to create homemade salad dressings. Proper handling of ingredients is also covered. Cooking - Soups and [employer] - Soups and Desserts Clinical staff conducted group or individual video education with verbal and written material and guidebook.  Patient learns that Pritikin soups and desserts make  for easy, nutritious, and delicious snacks and meal components that are low in sodium, fat, sugar, and calorie density, while high in vitamins, minerals, and filling fiber. Recommendations include simple and healthy ideas for soups and desserts.   Overview     The Pritikin Solution Program Overview Clinical staff conducted group or  individual video education with verbal and written material and guidebook.  Patient learns that the results of the Pritikin Program have been documented in more than 100 articles published in peer-reviewed journals, and the benefits include reducing risk factors for (and, in some cases, even reversing) high cholesterol, high blood pressure, type 2 diabetes, obesity, and more! An overview of the three key pillars of the Pritikin Program will be covered: eating well, doing regular exercise, and having a healthy mind-set.  WORKSHOPS  Exercise: Exercise Basics: Building Your Action Plan Clinical staff led group instruction and group discussion with PowerPoint presentation and patient guidebook. To enhance the learning environment the use of posters, models and videos may be added. At the conclusion of this workshop, patients will comprehend the difference between physical activity and exercise, as well as the benefits of incorporating both, into their routine. Patients will understand the FITT (Frequency, Intensity, Time, and Type) principle and how to use it to build an exercise action plan. In addition, safety concerns and other considerations for exercise and cardiac rehab will be addressed by the presenter. The purpose of this lesson is to promote a comprehensive and effective weekly exercise routine in order to improve patients overall level of fitness.   Managing Heart Disease: Your Path to a Healthier Heart Clinical staff led group instruction and group discussion with PowerPoint presentation and patient guidebook. To enhance the learning environment the use of posters, models and videos may be added.At the conclusion of this workshop, patients will understand the anatomy and physiology of the heart. Additionally, they will understand how Pritikins three pillars impact the risk factors, the progression, and the management of heart disease.  The purpose of this lesson is to provide a high-level  overview of the heart, heart disease, and how the Pritikin lifestyle positively impacts risk factors.  Exercise Biomechanics Clinical staff led group instruction and group discussion with PowerPoint presentation and patient guidebook. To enhance the learning environment the use of posters, models and videos may be added. Patients will learn how the structural parts of their bodies function and how these functions impact their daily activities, movement, and exercise. Patients will learn how to promote a neutral spine, learn how to manage pain, and identify ways to improve their physical movement in order to promote healthy living. The purpose of this lesson is to expose patients to common physical limitations that impact physical activity. Participants will learn practical ways to adapt and manage aches and pains, and to minimize their effect on regular exercise. Patients will learn how to maintain good posture while sitting, walking, and lifting.  Balance Training and Fall Prevention  Clinical staff led group instruction and group discussion with PowerPoint presentation and patient guidebook. To enhance the learning environment the use of posters, models and videos may be added. At the conclusion of this workshop, patients will understand the importance of their sensorimotor skills (vision, proprioception, and the vestibular system) in maintaining their ability to balance as they age. Patients will apply a variety of balancing exercises that are appropriate for their current level of function. Patients will understand the common causes for poor balance, possible solutions to these problems, and ways to  modify their physical environment in order to minimize their fall risk. The purpose of this lesson is to teach patients about the importance of maintaining balance as they age and ways to minimize their risk of falling.  WORKSHOPS   Nutrition:  Fueling a Ship Broker led group  instruction and group discussion with PowerPoint presentation and patient guidebook. To enhance the learning environment the use of posters, models and videos may be added. Patients will review the foundational principles of the Pritikin Eating Plan and understand what constitutes a serving size in each of the food groups. Patients will also learn Pritikin-friendly foods that are better choices when away from home and review make-ahead meal and snack options. Calorie density will be reviewed and applied to three nutrition priorities: weight maintenance, weight loss, and weight gain. The purpose of this lesson is to reinforce (in a group setting) the key concepts around what patients are recommended to eat and how to apply these guidelines when away from home by planning and selecting Pritikin-friendly options. Patients will understand how calorie density may be adjusted for different weight management goals.  Mindful Eating  Clinical staff led group instruction and group discussion with PowerPoint presentation and patient guidebook. To enhance the learning environment the use of posters, models and videos may be added. Patients will briefly review the concepts of the Pritikin Eating Plan and the importance of low-calorie dense foods. The concept of mindful eating will be introduced as well as the importance of paying attention to internal hunger signals. Triggers for non-hunger eating and techniques for dealing with triggers will be explored. The purpose of this lesson is to provide patients with the opportunity to review the basic principles of the Pritikin Eating Plan, discuss the value of eating mindfully and how to measure internal cues of hunger and fullness using the Hunger Scale. Patients will also discuss reasons for non-hunger eating and learn strategies to use for controlling emotional eating.  Targeting Your Nutrition Priorities Clinical staff led group instruction and group discussion with  PowerPoint presentation and patient guidebook. To enhance the learning environment the use of posters, models and videos may be added. Patients will learn how to determine their genetic susceptibility to disease by reviewing their family history. Patients will gain insight into the importance of diet as part of an overall healthy lifestyle in mitigating the impact of genetics and other environmental insults. The purpose of this lesson is to provide patients with the opportunity to assess their personal nutrition priorities by looking at their family history, their own health history and current risk factors. Patients will also be able to discuss ways of prioritizing and modifying the Pritikin Eating Plan for their highest risk areas  Menu  Clinical staff led group instruction and group discussion with PowerPoint presentation and patient guidebook. To enhance the learning environment the use of posters, models and videos may be added. Using menus brought in from [employer], or printed from [employer], patients will apply the Pritikin dining out guidelines that were presented in the [employer] video. Patients will also be able to practice these guidelines in a variety of provided scenarios. The purpose of this lesson is to provide patients with the opportunity to practice hands-on learning of the Pritikin Dining Out guidelines with actual menus and practice scenarios.  Label Reading Clinical staff led group instruction and group discussion with PowerPoint presentation and patient guidebook. To enhance the learning environment the use of posters, models and videos may  be added. Patients will review and discuss the Pritikin label reading guidelines presented in Pritikins Label Reading Educational series video. Using fool labels brought in from local grocery stores and markets, patients will apply the label reading guidelines and determine if the packaged food meet the Pritikin  guidelines. The purpose of this lesson is to provide patients with the opportunity to review, discuss, and practice hands-on learning of the Pritikin Label Reading guidelines with actual packaged food labels. Cooking School  Pritikins [employer] are designed to teach patients ways to prepare quick, simple, and affordable recipes at home. The importance of nutritions role in chronic disease risk reduction is reflected in its emphasis in the overall Pritikin program. By learning how to prepare essential core Pritikin Eating Plan recipes, patients will increase control over what they eat; be able to customize the flavor of foods without the use of added salt, sugar, or fat; and improve the quality of the food they consume. By learning a set of core recipes which are easily assembled, quickly prepared, and affordable, patients are more likely to prepare more healthy foods at home. These workshops focus on convenient breakfasts, simple entres, side dishes, and desserts which can be prepared with minimal effort and are consistent with nutrition recommendations for cardiovascular risk reduction. Cooking [employer] are taught by a armed forces logistics/support/administrative officer (RD) who has been trained by the [employer]. The chef or RD has a clear understanding of the importance of minimizing - if not completely eliminating - added fat, sugar, and sodium in recipes. Throughout the series of Cooking School Workshop sessions, patients will learn about healthy ingredients and efficient methods of cooking to build confidence in their capability to prepare    Cooking School weekly topics:  Adding Flavor- Sodium-Free  Fast and Healthy Breakfasts  Powerhouse Plant-Based Proteins  Satisfying Salads and Dressings  Simple Sides and Sauces  International Cuisine-Spotlight on the [employer] Zones  Delicious Desserts  Savory Soups  [employer] - Meals in a Astronomer Appetizers and  Snacks  Comforting Weekend Breakfasts  One-Pot Wonders   Fast Evening Meals  Landscape Architect Your Pritikin Plate  WORKSHOPS   Healthy Mindset (Psychosocial):  Focused Goals, Sustainable Changes Clinical staff led group instruction and group discussion with PowerPoint presentation and patient guidebook. To enhance the learning environment the use of posters, models and videos may be added. Patients will be able to apply effective goal setting strategies to establish at least one personal goal, and then take consistent, meaningful action toward that goal. They will learn to identify common barriers to achieving personal goals and develop strategies to overcome them. Patients will also gain an understanding of how our mind-set can impact our ability to achieve goals and the importance of cultivating a positive and growth-oriented mind-set. The purpose of this lesson is to provide patients with a deeper understanding of how to set and achieve personal goals, as well as the tools and strategies needed to overcome common obstacles which may arise along the way.  From Head to Heart: The Power of a Healthy Outlook  Clinical staff led group instruction and group discussion with PowerPoint presentation and patient guidebook. To enhance the learning environment the use of posters, models and videos may be added. Patients will be able to recognize and describe the impact of emotions and mood on physical health. They will discover the importance of self-care and explore self-care practices which may work for them. Patients will also  learn how to utilize the 4 Cs to cultivate a healthier outlook and better manage stress and challenges. The purpose of this lesson is to demonstrate to patients how a healthy outlook is an essential part of maintaining good health, especially as they continue their cardiac rehab journey.  Healthy Sleep for a Healthy Heart Clinical staff led group instruction and  group discussion with PowerPoint presentation and patient guidebook. To enhance the learning environment the use of posters, models and videos may be added. At the conclusion of this workshop, patients will be able to demonstrate knowledge of the importance of sleep to overall health, well-being, and quality of life. They will understand the symptoms of, and treatments for, common sleep disorders. Patients will also be able to identify daytime and nighttime behaviors which impact sleep, and they will be able to apply these tools to help manage sleep-related challenges. The purpose of this lesson is to provide patients with a general overview of sleep and outline the importance of quality sleep. Patients will learn about a few of the most common sleep disorders. Patients will also be introduced to the concept of sleep hygiene, and discover ways to self-manage certain sleeping problems through simple daily behavior changes. Finally, the workshop will motivate patients by clarifying the links between quality sleep and their goals of heart-healthy living.   Recognizing and Reducing Stress Clinical staff led group instruction and group discussion with PowerPoint presentation and patient guidebook. To enhance the learning environment the use of posters, models and videos may be added. At the conclusion of this workshop, patients will be able to understand the types of stress reactions, differentiate between acute and chronic stress, and recognize the impact that chronic stress has on their health. They will also be able to apply different coping mechanisms, such as reframing negative self-talk. Patients will have the opportunity to practice a variety of stress management techniques, such as deep abdominal breathing, progressive muscle relaxation, and/or guided imagery.  The purpose of this lesson is to educate patients on the role of stress in their lives and to provide healthy techniques for coping with  it.  Learning Barriers/Preferences:  Learning Barriers/Preferences - 10/26/24 1556       Learning Barriers/Preferences   Learning Barriers Hearing    Learning Preferences Individual Instruction;Group Instruction;Skilled Demonstration          Education Topics:  Knowledge Questionnaire Score:  Knowledge Questionnaire Score - 10/26/24 1414       Knowledge Questionnaire Score   Pre Score 23/24          Core Components/Risk Factors/Patient Goals at Admission:  Personal Goals and Risk Factors at Admission - 10/26/24 1557       Core Components/Risk Factors/Patient Goals on Admission    Weight Management Yes;Obesity;Weight Loss    Intervention Weight Management: Develop a combined nutrition and exercise program designed to reach desired caloric intake, while maintaining appropriate intake of nutrient and fiber, sodium and fats, and appropriate energy expenditure required for the weight goal.;Weight Management: Provide education and appropriate resources to help participant work on and attain dietary goals.;Weight Management/Obesity: Establish reasonable short term and long term weight goals.;Obesity: Provide education and appropriate resources to help participant work on and attain dietary goals.    Admit Weight 287 lb 11.2 oz (130.5 kg)    Goal Weight: Short Term 275 lb (124.7 kg)    Goal Weight: Long Term 200 lb (90.7 kg)    Expected Outcomes Short Term: Continue to assess and modify interventions  until short term weight is achieved;Long Term: Adherence to nutrition and physical activity/exercise program aimed toward attainment of established weight goal;Weight Loss: Understanding of general recommendations for a balanced deficit meal plan, which promotes 1-2 lb weight loss per week and includes a negative energy balance of [phone] kcal/d;Understanding recommendations for meals to include 15-35% energy as protein, 25-35% energy from fat, 35-60% energy from carbohydrates, less than  200mg  of dietary cholesterol, 20-35 gm of total fiber daily;Understanding of distribution of calorie intake throughout the day with the consumption of 4-5 meals/snacks    Diabetes Yes    Intervention Provide education about signs/symptoms and action to take for hypo/hyperglycemia.;Provide education about proper nutrition, including hydration, and aerobic/resistive exercise prescription along with prescribed medications to achieve blood glucose in normal ranges: Fasting glucose 65-99 mg/dL    Expected Outcomes Short Term: Participant verbalizes understanding of the signs/symptoms and immediate care of hyper/hypoglycemia, proper foot care and importance of medication, aerobic/resistive exercise and nutrition plan for blood glucose control.;Long Term: Attainment of HbA1C < 7%.    Hypertension Yes    Intervention Provide education on lifestyle modifcations including regular physical activity/exercise, weight management, moderate sodium restriction and increased consumption of fresh fruit, vegetables, and low fat dairy, alcohol moderation, and smoking cessation.;Monitor prescription use compliance.    Expected Outcomes Short Term: Continued assessment and intervention until BP is < 140/52mm HG in hypertensive participants. < 130/18mm HG in hypertensive participants with diabetes, heart failure or chronic kidney disease.;Long Term: Maintenance of blood pressure at goal levels.    Lipids Yes    Intervention Provide education and support for participant on nutrition & aerobic/resistive exercise along with prescribed medications to achieve LDL 70mg , HDL >40mg .    Expected Outcomes Short Term: Participant states understanding of desired cholesterol values and is compliant with medications prescribed. Participant is following exercise prescription and nutrition guidelines.;Long Term: Cholesterol controlled with medications as prescribed, with individualized exercise RX and with personalized nutrition plan. Value  goals: LDL < 70mg , HDL > 40 mg.    Stress Yes    Intervention Offer individual and/or small group education and counseling on adjustment to heart disease, stress management and health-related lifestyle change. Teach and support self-help strategies.;Refer participants experiencing significant psychosocial distress to appropriate mental health specialists for further evaluation and treatment. When possible, include family members and significant others in education/counseling sessions.    Expected Outcomes Short Term: Participant demonstrates changes in health-related behavior, relaxation and other stress management skills, ability to obtain effective social support, and compliance with psychotropic medications if prescribed.;Long Term: Emotional wellbeing is indicated by absence of clinically significant psychosocial distress or social isolation.          Core Components/Risk Factors/Patient Goals Review:   Goals and Risk Factor Review     Row Name 11/02/24 0946             Core Components/Risk Factors/Patient Goals Review   Personal Goals Review Diabetes;Hypertension;Lipids;Stress;Weight Management/Obesity       Review Bazil has attended one cardiac rehab session thus far since his 11/01/24 start.  Vital signs have been stable, MET levels have maintained       Expected Outcomes Naji will continue to particpate in cardiac rehab for exercise nutrition and lifestyle modifications.          Core Components/Risk Factors/Patient Goals at Discharge (Final Review):   Goals and Risk Factor Review - 11/02/24 0946       Core Components/Risk Factors/Patient Goals Review   Personal Goals Review Diabetes;Hypertension;Lipids;Stress;Weight  Management/Obesity    Review Rhoderick has attended one cardiac rehab session thus far since his 11/01/24 start.  Vital signs have been stable, MET levels have maintained    Expected Outcomes Lamarco will continue to particpate in cardiac rehab for exercise nutrition  and lifestyle modifications.          ITP Comments:  ITP Comments     Row Name 10/26/24 1304 11/01/24 1436 11/02/24 0942       ITP Comments Wilbert Holland, MD: Medical Director.  Introduction to the [facility] education [facility]/Intensive Cardiac Rehab.  Initial orientation packet reviewed with the patient. 30 day ITP Review. Jahir started cardiac rehab on 11/01/24. Sylas did well with exercise for his fitness level. 30 day ITP Review. Paydon started cardiac rehab on 11/01/24 and has attended 1 cardiac rehab session thus far.  Harman did well with exercise for his fitness level.        Comments: see ITP comments    [1]  Current Outpatient Medications:    Accu-Chek FastClix Lancets MISC, Check blood sugar three times daily, Disp: 300 each, Rfl: 1   amLODipine (NORVASC) 10 MG tablet, Take 10 mg by mouth daily., Disp: , Rfl:    aspirin EC 81 MG tablet, Take 81 mg by mouth daily. Swallow whole., Disp: , Rfl:    Blood Glucose Monitoring Suppl (ACCU-CHEK GUIDE) w/Device KIT, 1 Device by Does not apply route 3 (three) times daily., Disp: 1 kit, Rfl: 0   cetirizine (ZYRTEC) 10 MG tablet, Take 10 mg by mouth daily., Disp: , Rfl:    Continuous Glucose Sensor (DEXCOM G7 SENSOR) MISC, 1 Device by Does not apply route as directed. Change sensor every 10 days, Disp: 9 each, Rfl: 3   fenofibrate  micronized (LOFIBRA) 200 MG capsule, Take 1 capsule (200 mg total) by mouth daily before breakfast., Disp: 90 capsule, Rfl: 3   glucose blood (ACCU-CHEK GUIDE TEST) test strip, 1 each by Other route 3 (three) times daily. Use as instructed, Disp: 300 each, Rfl: 12   HYDROcodone-acetaminophen (NORCO) 7.5-325 MG tablet, Take 1 tablet by mouth 3 (three) times daily., Disp: , Rfl:    ibuprofen (ADVIL) 600 MG tablet, Take 600 mg by mouth every 8 (eight) hours as needed (pain)., Disp: , Rfl:    insulin  glargine, 2 Unit Dial, (TOUJEO  MAX SOLOSTAR) 300 UNIT/ML Solostar Pen, Inject 100 Units into the skin daily in the  afternoon., Disp: 15 mL, Rfl: 3   insulin  lispro (HUMALOG  KWIKPEN) 200 UNIT/ML KwikPen, Inject 30 Units into the skin 3 (three) times daily., Disp: 15 mL, Rfl: 4   Insulin  Pen Needle 32G X 4 MM MISC, 1 Device by Does not apply route in the morning, at noon, in the evening, and at bedtime., Disp: 400 each, Rfl: 3   isosorbide  mononitrate (IMDUR ) 30 MG 24 hr tablet, Take 1 tablet (30 mg total) by mouth daily., Disp: 90 tablet, Rfl: 3   Levothyroxine Sodium (SYNTHROID PO), Take 50 mcg by mouth daily., Disp: , Rfl:    lisinopril-hydrochlorothiazide (ZESTORETIC) 20-25 MG tablet, Take 1 tablet by mouth daily., Disp: , Rfl:    metoprolol succinate (TOPROL-XL) 50 MG 24 hr tablet, Take 50 mg by mouth daily., Disp: , Rfl:    nitroGLYCERIN  (NITROSTAT ) 0.4 MG SL tablet, Place 1 tablet (0.4 mg total) under the tongue every 5 (five) minutes as needed for chest pain., Disp: 25 tablet, Rfl: 3   rosuvastatin  (CRESTOR ) 20 MG tablet, Take 1 tablet (20 mg total) by mouth daily.,  Disp: 90 tablet, Rfl: 3   sertraline (ZOLOFT) 50 MG tablet, Take 50 mg by mouth daily., Disp: , Rfl:  [2]  Social History Tobacco Use  Smoking Status Never   Passive exposure: Current  Smokeless Tobacco Never

## 2024-11-03 ENCOUNTER — Encounter (HOSPITAL_COMMUNITY)
Admission: RE | Admit: 2024-11-03 | Discharge: 2024-11-03 | Disposition: A | Discharge status: Home / Self Care | Source: Ambulatory Visit | Attending: Cardiology | Admitting: Cardiology

## 2024-11-03 DIAGNOSIS — I2089 Other forms of angina pectoris: Secondary | ICD-10-CM

## 2024-11-05 ENCOUNTER — Encounter (HOSPITAL_COMMUNITY)
Admission: RE | Admit: 2024-11-05 | Discharge: 2024-11-05 | Disposition: A | Discharge status: Home / Self Care | Source: Ambulatory Visit | Attending: Cardiology | Admitting: Cardiology

## 2024-11-05 DIAGNOSIS — I10 Essential (primary) hypertension: Secondary | ICD-10-CM | POA: Insufficient documentation

## 2024-11-05 DIAGNOSIS — I2089 Other forms of angina pectoris: Secondary | ICD-10-CM | POA: Insufficient documentation

## 2024-11-05 DIAGNOSIS — E119 Type 2 diabetes mellitus without complications: Secondary | ICD-10-CM | POA: Insufficient documentation

## 2024-11-06 LAB — LIPID PANEL
Chol/HDL Ratio: 16.3 ratio — ABNORMAL HIGH (ref 0.0–5.0)
Cholesterol, Total: 261 mg/dL — ABNORMAL HIGH (ref 100–199)
HDL: 16 mg/dL — ABNORMAL LOW
Triglycerides: 1435 mg/dL (ref 0–149)

## 2024-11-06 LAB — LIPOPROTEIN A (LPA): Lipoprotein (a): <8.4 nmol/L

## 2024-11-06 LAB — LDL CHOLESTEROL, DIRECT: LDL Direct: 44 mg/dL (ref 0–99)

## 2024-11-08 ENCOUNTER — Encounter (HOSPITAL_COMMUNITY)
Admission: RE | Admit: 2024-11-08 | Discharge: 2024-11-08 | Disposition: A | Discharge status: Home / Self Care | Source: Ambulatory Visit | Attending: Cardiology | Admitting: Cardiology

## 2024-11-08 ENCOUNTER — Encounter: Attending: Internal Medicine | Admitting: Dietician

## 2024-11-08 ENCOUNTER — Encounter: Payer: Self-pay | Admitting: Dietician

## 2024-11-08 DIAGNOSIS — E1165 Type 2 diabetes mellitus with hyperglycemia: Secondary | ICD-10-CM | POA: Diagnosis present

## 2024-11-08 DIAGNOSIS — I2089 Other forms of angina pectoris: Secondary | ICD-10-CM

## 2024-11-08 DIAGNOSIS — Z794 Long term (current) use of insulin: Secondary | ICD-10-CM | POA: Diagnosis present

## 2024-11-08 NOTE — Patient Instructions (Addendum)
 Move the snacks away from your chair (back to the kitchen) or consider not buying snacks.  Find ways to continue to increase your vegetables. 1/2 your plate should be vegetables.  Drink more water.  Rethink your other drinks.  Continue to stay active  [facility] has a pool and is free.

## 2024-11-08 NOTE — Progress Notes (Signed)
 " Diabetes Self-Management Education  Visit Type: Follow-up  Appt. Start Time: 1355 Appt. End Time: 1630  11/08/2024  [name], identified by name and date of birth, is a 64 y.o. male with a diagnosis of Diabetes:  .   ASSESSMENT Patient is here today with his wife.  He was last seen by this RD on 09/06/2024.  He is now going to cardiac rehab.  Blood glucose has to be between 110-300 to be able to exercise. Frequently forgets to take his Novolog before he eats and takes this during or after he eats. Boredom and night snacking less.  States that he tries to eat less overall.  Aiming to keep to one portion. Eating whole wheat bread rather than white.   He is trying to use less added salt - used to add a very large amount of added salt. Those that are cooking are trying to reduce meat slightly. Both patient and wife find lifestyle change very difficult.  His wife is going to the nutrition lectures and cooking programs at Cardiac Rehab.  Referral:  Type 2 Diabetes.  Dr. Sam   History includes:  Type 2 Diabetes (1994) HTN, hypothyroidism Medications include:  Toujeo  100 units q am, Humalog  30 + sliding scale (BG - 120/20) before meals, MVI Labs noted to include:  A1C 7.1% 06/16/2024, eGFR 84 Lipid Panel     Component Value Date/Time   CHOL 261 (H) 11/05/2024 1050   TRIG 1,435 (HH) 11/05/2024 1050   HDL 16 (L) 11/05/2024 1050   CHOLHDL 16.3 (H) 11/05/2024 1050   LDLCALC Comment (A) 11/05/2024 1050   LDLDIRECT 44 11/05/2024 1050   LABVLDL Comment (A) 11/05/2024 1050      CGM: Dexcom G7   CGM Results from download: 09/06/2024 11/08/24  % Time CGM active:   94 %   (Goal >70%) 96  Average glucose:   188 mg/dL for 14 days [id number]  Glucose management indicator:   7.8 % 8.1  Time in range (70-180 mg/dL):   55 %   (Goal >29%) 45  Time High (181-250 mg/dL):   20 %   (Goal < 74%) 27  Time Very High (>250 mg/dL):    24 %   (Goal < 5%) 27  Time Low (54-69 mg/dL):   1 %   (Goal <5%)  1  Time Very Low (<54 mg/dL):   <1 %   (Goal <8%) <1  %CV (glucose variability)     %  (Goal <36%) 39.1    70 281 lbs 11/08/2024 288 lbs 09/06/2024 160 lbs 1994 (when diagnosed with diabetes) UBW 280 lbs    Patient lives with his wife and brother-in-law.  Patient and his brother-in-law do the shopping and cooking. 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 Would like to go to the [facility] but doesn't know if his insurance covers this and they are not sure when to find time. Bowls once per week. Hips hurt when he walks. Enjoys swimming.  There were no vitals taken for this visit. There is no height or weight on file to calculate BMI.   Diabetes Self-Management Education - 11/08/24 1521       Visit Information   Visit Type Follow-up      Psychosocial Assessment   Patient Belief/Attitude about Diabetes Other (comment)   change is difficult   What is the hardest part about your diabetes right now, causing you the most concern, or is the most worrisome to you  about your diabetes?   Making healty food and beverage choices    Self-care barriers None    Self-management support Doctor's office;CDE visits   cardiac rehab   Other persons present Patient;Spouse/SO    Patient Concerns Nutrition/Meal planning;Problem Solving;Weight Control;Glycemic Control    Special Needs None    Preferred Learning Style No preference indicated    Learning Readiness Contemplating      Pre-Education Assessment   Patient understands the diabetes disease and treatment process. Comprehends key points    Patient understands incorporating nutritional management into lifestyle. Needs Review    Patient undertands incorporating physical activity into lifestyle. Needs Review    Patient understands using medications safely. Needs Review    Patient understands monitoring blood glucose, interpreting and using results Needs Review    Patient understands prevention, detection, and treatment of  acute complications. Needs Review    Patient understands prevention, detection, and treatment of chronic complications. Compreheands key points    Patient understands how to develop strategies to address psychosocial issues. Needs Review    Patient understands how to develop strategies to promote health/change behavior. Needs Review      Complications   How often do you check your blood sugar? > 4 times/day    Fasting Blood glucose range (mg/dL) 29-870;869-820    Postprandial Blood glucose range (mg/dL) 869-820;819-799;>799    Number of hypoglycemic episodes per month 3      Dietary Intake   Breakfast 2 eggs, sausage, 2 slices [employer] toast, butter, coffee with splenda and creamer    Snack (morning) None    Lunch Baked pasta, salad, ginger dressing, 2 slices garlic bread    Snack (afternoon) [employer] Oatmeal cookie.    Dinner None    Snack (evening) Handful of potato chips    Beverage(s) Water, Coffee with splenda and creamer, diet Mt. Dew (3-4 daily)      Activity / Exercise   Activity / Exercise Type Light (walking / raking leaves)    How many days per week do you exercise? 3    How many minutes per day do you exercise? 45    Total minutes per week of exercise 135      Patient Education   Previous Diabetes Education Yes   09/2024   Healthy Eating Role of diet in the treatment of diabetes and the relationship between the three main macronutrients and blood glucose level;Plate Method;Meal options for control of blood glucose level and chronic complications.    Being Active Role of exercise on diabetes management, blood pressure control and cardiac health.    Medications Reviewed patients medication for diabetes, action, purpose, timing of dose and side effects.      Subsequent Visit   Since your last visit have you continued or begun to take your medications as prescribed? No    Since your last visit have you experienced any weight changes? Loss    Weight Loss (lbs) 7    Since  your last visit, are you checking your blood glucose at least once a day? Yes   dexcom         Individualized Plan for Diabetes Self-Management Training:   Learning Objective:  Patient will have a greater understanding of diabetes self-management. Patient education plan is to attend individual and/or group sessions per assessed needs and concerns.   Plan:   Patient Instructions  Move the snacks away from your chair (back to the kitchen) or consider not buying snacks.  Find ways to continue  to increase your vegetables. 1/2 your plate should be vegetables.  Drink more water.  Rethink your other drinks.  Continue to stay active  [facility] has a pool and is free.  Expected Outcomes:     Education material provided:   If problems or questions, patient to contact team via:  Phone  Future DSME appointment:        "

## 2024-11-10 ENCOUNTER — Encounter: Payer: Self-pay | Admitting: Internal Medicine

## 2024-11-10 ENCOUNTER — Encounter (HOSPITAL_COMMUNITY)
Admission: RE | Admit: 2024-11-10 | Discharge: 2024-11-10 | Disposition: A | Discharge status: Home / Self Care | Source: Ambulatory Visit | Attending: Cardiology | Admitting: Cardiology

## 2024-11-10 ENCOUNTER — Ambulatory Visit: Admitting: Internal Medicine

## 2024-11-10 ENCOUNTER — Telehealth: Payer: Self-pay

## 2024-11-10 ENCOUNTER — Encounter (HOSPITAL_COMMUNITY): Payer: Self-pay

## 2024-11-10 VITALS — BP 146/60 | Ht 71.0 in | Wt 283.0 lb

## 2024-11-10 DIAGNOSIS — E1165 Type 2 diabetes mellitus with hyperglycemia: Secondary | ICD-10-CM

## 2024-11-10 DIAGNOSIS — I2089 Other forms of angina pectoris: Secondary | ICD-10-CM

## 2024-11-10 DIAGNOSIS — E785 Hyperlipidemia, unspecified: Secondary | ICD-10-CM | POA: Diagnosis not present

## 2024-11-10 DIAGNOSIS — Z794 Long term (current) use of insulin: Secondary | ICD-10-CM | POA: Diagnosis not present

## 2024-11-10 LAB — POCT GLYCOSYLATED HEMOGLOBIN (HGB A1C): Hemoglobin A1C: 7 % — AB (ref 4.0–5.6)

## 2024-11-10 MED ORDER — TIRZEPATIDE 2.5 MG/0.5ML ~~LOC~~ SOAJ: 2.5000 mg | SUBCUTANEOUS | 3 refills | Status: DC

## 2024-11-10 NOTE — Patient Instructions (Addendum)
 Start Mounjaro  2.5 mg weekly  Take Toujeo  100 units once daily Take Humalog  30 units before each meal Humalog  correctional insulin : ADD extra units on insulin  to your meal-time Humalog  dose if your blood sugars are higher than 150. Use the scale below to help guide you:   Blood sugar before meal Number of units to inject  Less than 150 0 unit  151 -  170 1 units  171 -  190 2 units  191 -  210 3 units  211 -  230 4 units  231 -  250 5 units  251 -  270 6 units  271 -  290 7 units  291 -  310 8 units  311 - 330 9 units  331 - 350 10 units  351 - 370 11 units     HOW TO TREAT LOW BLOOD SUGARS (Blood sugar LESS THAN 70 MG/DL) Please follow the RULE OF 15 for the treatment of hypoglycemia treatment (when your (blood sugars are less than 70 mg/dL)   STEP 1: Take 15 grams of carbohydrates when your blood sugar is low, which includes:  3-4 GLUCOSE TABS  OR 3-4 OZ OF JUICE OR REGULAR SODA OR ONE TUBE OF GLUCOSE GEL    STEP 2: RECHECK blood sugar in 15 MINUTES STEP 3: If your blood sugar is still low at the 15 minute recheck --> then, go back to STEP 1 and treat AGAIN with another 15 grams of carbohydrates.

## 2024-11-10 NOTE — Progress Notes (Addendum)
 Pt presented for exercise at cardiac rehab today and his resting, pre-exercise BP was 175/71. Denies SOB, CP, blurred vision, endorses drinking two cups of coffee daily and having taken his BP medication today. Onsite provider informed and pt educated that he needs to continue to monitor his BP, 2 hours after taking his BP meds, and report BP consistently greater than 130/80 mmHg to his cardiologist. Otherwise, if his BP is stable, he is okay to return to cardiac rehab.  Pt stated he wasn't sure if his home BP machine was reliable, as it almost always reads high.  Educated pt to bring his home BP machine in if he wants us  to verify its accuracy.  Pt verbalized understanding of when to take his blood pressures, when to report his blood pressures to his cardiologist, and when he is okay to return to CR.  Addendum:  Pt returned to CR and informed us  he had called his wife to find out if he had taken his medications this morning.  Upon investigation, he reported she discovered his Wednesday medications in his weekly pill case had not been taken.

## 2024-11-10 NOTE — Telephone Encounter (Signed)
 Patient called to let us  know that his mounjaro  will cost $1000 out of pocket.

## 2024-11-10 NOTE — Progress Notes (Signed)
 " Name: Ronald Bauer  MRN/ DOB: [id number], 1961/07/19   Age/ Sex: 64 y.o., male    PCP: Ronald Ingle, MD   Reason for Endocrinology Evaluation: Type 2 Diabetes Mellitus     Date of Initial Endocrinology Visit: 08/04/2024    PATIENT IDENTIFIER: [name] is a 64 y.o. male with a past medical history of DM, dyslipidemia and HTN. The patient presented for initial endocrinology clinic visit on 08/04/2024 for consultative assistance with his diabetes management.    HPI: [name] was    Diagnosed with DM since his 30s.  Prior Medications tried/Intolerance: He has been on insulin  shortly after his diagnosis  Hemoglobin A1c has ranged from 7.1% in 06/2024   Patient follows with cardiology for left bundle branch block   On his initial visit to our clinic had an A1c of 7.1% he was on NPH and regular insulin  which I had switched to insulin  analogs   He did meet with our RD in November, 2025  SUBJECTIVE:   During the last visit (08/04/2024): A1c 7.1%  Today (11/10/2024): Ronald Bauer is here for follow-up on diabetes management.  He checks blood sugars multiple times daily. The patient has not had hypoglycemic episodes since the last clinic visit.   Patient continues to follow-up with cardiology for CAD, LBBB  No nausea No constipation  No diarrhea   HOME ENDOCRINE REGIMEN: Toujeo  100 units daily Humalog  30 units with each meal CF: Humalog  (BG -120/20) Levothyroxine 50 mcg daily Rosuvastatin  20 mg daily Fenofibrate  200 mg daily    Statin: no ACE-I/ARB: yes Prior Diabetic Education: yes   CONTINUOUS GLUCOSE MONITORING RECORD INTERPRETATION    Dates of Recording: 12/25 - 11/10/2024  Sensor description: Dexcom  Results statistics:   CGM use % of time 93  Average and SD 199/80  Time in range    48    %  % Time Above 180 24  % Time above 250 27  % Time Below target 1   Glycemic patterns summary: BGs remain elevated throughout the day and night  Hyperglycemic  episodes mostly postprandial  Hypoglycemic episodes occurred N/A  Overnight periods: Variable but mostly high    DIABETIC COMPLICATIONS: Microvascular complications:   Denies: CKD Last eye exam: Completed 12/02/2023  Macrovascular complications:   Denies: CAD, PVD, CVA   PAST HISTORY: Past Medical History:  Past Medical History:  Diagnosis Date   Diabetes mellitus without complication (HCC)    Hypertension    Thyroid disease    Past Surgical History: No past surgical history on file.  Social History:  reports that he has never smoked. He has been exposed to tobacco smoke. He has never used smokeless tobacco. He reports that he does not drink alcohol and does not use drugs. Family History:  Family History  Problem Relation Age of Onset   Hyperlipidemia Father    Hypertension Father    Diabetes Father    Heart attack Father        Cause of death at 87   Stroke Paternal Grandmother    Cancer Paternal Aunt        possible lung. cause of death December 02, 2023   Cancer Cousin        colon     HOME MEDICATIONS: Allergies as of 11/10/2024       Reactions   Other    Seasonal allergies - sneezing watery eyes        Medication List  Accurate as of November 10, 2024  2:16 PM. If you have any questions, ask your nurse or doctor.          Accu-Chek FastClix Lancets Misc Check blood sugar three times daily   Accu-Chek Guide Test test strip Generic drug: glucose blood 1 each by Other route 3 (three) times daily. Use as instructed   Accu-Chek Guide w/Device Kit 1 Device by Does not apply route 3 (three) times daily.   amLODipine 10 MG tablet Commonly known as: NORVASC Take 10 mg by mouth daily.   aspirin EC 81 MG tablet Take 81 mg by mouth daily. Swallow whole.   cetirizine 10 MG tablet Commonly known as: ZYRTEC Take 10 mg by mouth daily.   Dexcom G7 Sensor Misc 1 Device by Does not apply route as directed. Change sensor every 10 days   fenofibrate  micronized  200 MG capsule Commonly known as: LOFIBRA Take 1 capsule (200 mg total) by mouth daily before breakfast.   HumaLOG  KwikPen 200 UNIT/ML KwikPen Generic drug: insulin  lispro Inject 30 Units into the skin 3 (three) times daily.   HYDROcodone-acetaminophen 7.5-325 MG tablet Commonly known as: NORCO Take 1 tablet by mouth 3 (three) times daily.   ibuprofen 600 MG tablet Commonly known as: ADVIL Take 600 mg by mouth every 8 (eight) hours as needed (pain).   Insulin  Pen Needle 32G X 4 MM Misc 1 Device by Does not apply route in the morning, at noon, in the evening, and at bedtime.   isosorbide  mononitrate 30 MG 24 hr tablet Commonly known as: IMDUR  Take 1 tablet (30 mg total) by mouth daily.   lisinopril-hydrochlorothiazide 20-25 MG tablet Commonly known as: ZESTORETIC Take 1 tablet by mouth daily.   metoprolol succinate 50 MG 24 hr tablet Commonly known as: TOPROL-XL Take 50 mg by mouth daily.   nitroGLYCERIN  0.4 MG SL tablet Commonly known as: NITROSTAT  Place 1 tablet (0.4 mg total) under the tongue every 5 (five) minutes as needed for chest pain.   rosuvastatin  20 MG tablet Commonly known as: Crestor  Take 1 tablet (20 mg total) by mouth daily.   sertraline 50 MG tablet Commonly known as: ZOLOFT Take 50 mg by mouth daily.   SYNTHROID PO Take 50 mcg by mouth daily.   tirzepatide  2.5 MG/0.5ML Pen Commonly known as: MOUNJARO  Inject 2.5 mg into the skin once a week. Started by: Donell Butts, MD   Toujeo  Max SoloStar 300 UNIT/ML Solostar Pen Generic drug: insulin  glargine (2 Unit Dial) Inject 100 Units into the skin daily in the afternoon.         ALLERGIES: Allergies  Allergen Reactions   Other     Seasonal allergies - sneezing watery eyes     REVIEW OF SYSTEMS: A comprehensive ROS was conducted with the patient and is negative except as per HPI    OBJECTIVE:   VITAL SIGNS: BP (!) 146/60   Ht 5' 11 (1.803 m)   Wt 283 lb (128.4 kg)   BMI 39.47  kg/m    PHYSICAL EXAM:  General: Pt appears well and is in NAD  Neck: General: Supple without adenopathy or carotid bruits. Thyroid: Thyroid size normal.  No goiter or nodules appreciated.   Lungs: Clear with good BS bilat   Heart: RRR   Abdomen:  soft, nontender  Extremities:  Lower extremities - + 1pretibial edema.   Neuro: MS is good with appropriate affect, pt is alert and Ox3    DATA REVIEWED:   Latest  Reference Range & Units 11/05/24 10:50  Total CHOL/HDL Ratio 0.0 - 5.0 ratio 83.6 (H)  Cholesterol, Total 100 - 199 mg/dL 738 (H)  HDL Cholesterol >39 mg/dL 16 (L)  Direct LDL 0 - 99 mg/dL 44  Lipoprotein (a) <24.9 nmol/L <8.4  Triglycerides 0 - 149 mg/dL 8,564 (HH)  VLDL Cholesterol Cal 5 - 40 mg/dL Comment !  LDL Chol Calc (NIH) 0 - 99 mg/dL Comment !  LDL CALC COMMENT:  Comment        Labs through PCPs office 06/16/2024  Total cholesterol 291 HDL 28 Triglyceride 1263 BUN 21 Creatinine 1 GFR 84 A1c 7.1 TSH 1.87   Old records , labs and images have been reviewed.    ASSESSMENT / PLAN / RECOMMENDATIONS:   1) Type 2 Diabetes Mellitus, acceptable control- Most recent A1c of 7.0 %. Goal A1c < 7.0 %.    - Patient doing well on insulin  analogs Toujeo /Humalog  -In reviewing CGM download, the patient has been noted with insulin -carbohydrate mismatch -He continues to work on improving his diet and making healthier choices -We did discuss GLP-1 agonist, we did discuss the risk of pancreatitis especially in the setting of extremely high triglycerides, we opted to start him on a very small dose, he was provided with #4 pens of Mounjaro .  - Patient contacted our office after the visit stating that his co-pay for Mounjaro  is $1000, a message was sent to our prior authorization team to see if we can navigate through this   MEDICATIONS:  Start Mounjaro  2.5 mg weekly Take Toujeo  100 units once daily Take Humalog  30 units with each meal Continue CF: Humalog (BG  -120/20)  EDUCATION / INSTRUCTIONS: BG monitoring instructions: Patient is instructed to check his blood sugars 3 times a day, before meals. Call [facility] if: BG persistently < 70  I reviewed the Rule of 15 for the treatment of hypoglycemia in detail with the patient. Literature supplied.    3) Dyslipidemia:  - Patient with long history of hypertriglyceridemia.  As high as 2000 mg/DL - Most likely there is a hereditary component to this but also dietary causes - Discussed the risk of pancreatitis with high triglycerides level - He did meet with our RD to discuss low-carb low-fat diet - He is following with cardiology  Medication Continue rosuvastatin  20 mg daily Continue fenofibrate  to 200 mg daily   Follow-up in 3 months    In addition to time spent performing glucose monitor, I spent 25 minutes preparing to see the patient by review of recent labs, imaging and procedures, obtaining and reviewing separately obtained history, communicating with the patient/family or caregiver, ordering medications, tests or procedures, and documenting clinical information in the EHR including the differential Dx, treatment, and any further evaluation and other management   Signed electronically by: Stefano Redgie Butts, MD  [facility] Endocrinology  [facility] [address] [place], [place] [address] Phone: [phone] FAX: [phone]   CC: Ronald Ingle, MD [address] [place] [address] Phone: [phone]  Fax: [phone]    Return to Endocrinology clinic as below: Future Appointments  Date Time Provider Department Center  11/12/2024 12:30 PM [facility] PHASE II EXC [facility] None  11/15/2024 12:30 PM [facility] PHASE II EXC [facility] None  11/17/2024 12:30 PM [facility] PHASE II EXC [facility] None  11/19/2024 12:30 PM [facility] PHASE II EXC [facility] None  11/22/2024 12:30 PM [facility] PHASE II EXC [facility] None  11/24/2024 12:30 PM  [facility] PHASE II  EXC [facility] None  11/26/2024 12:30 PM [facility] PHASE II EXC [facility] None  11/29/2024 12:30 PM [facility] PHASE II EXC [facility] None  12/01/2024 12:30 PM [facility] PHASE II EXC [facility] None  12/03/2024 12:30 PM [facility] PHASE II EXC [facility] None  12/06/2024 12:30 PM [facility] PHASE II EXC [facility] None  12/08/2024 12:30 PM [facility] PHASE II EXC [facility] None  12/09/2024  1:20 PM [name] Slain, MD [facility] [id number]  12/10/2024 12:30 PM [facility] PHASE II EXC [facility] None  12/13/2024 12:30 PM [facility] PHASE II EXC [facility] None  12/15/2024 12:30 PM [facility] PHASE II EXC [facility] None  12/17/2024 12:30 PM [facility] PHASE II EXC [facility] None  12/20/2024 12:30 PM [facility] PHASE II EXC [facility] None  12/22/2024 12:30 PM [facility] PHASE II EXC [facility] None  12/24/2024 12:30 PM [facility] PHASE II EXC [facility] None  12/27/2024 12:30 PM [facility] PHASE II EXC [facility] None  12/29/2024 12:30 PM [facility] PHASE II EXC [facility] None  12/31/2024 12:30 PM [facility] PHASE II EXC [facility] None  01/03/2025 12:30 PM [facility] PHASE II EXC [facility] None  01/05/2025 12:30 PM [facility] PHASE II EXC [facility] None  01/07/2025 12:30 PM [facility] PHASE II EXC [facility] None  01/10/2025 12:30 PM [facility] PHASE II EXC [facility] None  01/12/2025 12:30 PM [facility] PHASE II EXC [facility] None  01/14/2025 12:30 PM [facility] PHASE II EXC [facility] None  01/17/2025 12:30 PM [facility] PHASE II EXC [facility] None  01/19/2025 12:30 PM [facility] PHASE II EXC [facility] None  04/04/2025  2:00 PM Jobe, Leita CROME, RD [facility] NDM     "

## 2024-11-11 ENCOUNTER — Other Ambulatory Visit (HOSPITAL_COMMUNITY): Payer: Self-pay

## 2024-11-12 ENCOUNTER — Telehealth: Payer: Self-pay

## 2024-11-12 ENCOUNTER — Encounter (HOSPITAL_COMMUNITY)
Admission: RE | Admit: 2024-11-12 | Discharge: 2024-11-12 | Disposition: A | Source: Ambulatory Visit | Attending: Cardiology

## 2024-11-12 ENCOUNTER — Other Ambulatory Visit (HOSPITAL_COMMUNITY): Payer: Self-pay

## 2024-11-12 DIAGNOSIS — I2089 Other forms of angina pectoris: Secondary | ICD-10-CM

## 2024-11-12 NOTE — Telephone Encounter (Signed)
 Pharmacy Patient Advocate Encounter   Received notification from Pt Calls Messages that prior authorization for Mounjaro  2.5MG /0.5ML auto-injectors is required/requested.   Insurance verification completed.   The patient is insured through [facility].   Per test claim: PA required and submitted KEY/EOC/Request #: [id number] from 11/12/2024 to 11/12/2025. Unable to obtain price due to refill too soon rejection, last fill date 11/10/2024 next available fill date03/09/2025.    Case ID: [id number]

## 2024-11-15 ENCOUNTER — Encounter (HOSPITAL_COMMUNITY)
Admission: RE | Admit: 2024-11-15 | Discharge: 2024-11-15 | Disposition: A | Source: Ambulatory Visit | Attending: Cardiology

## 2024-11-15 DIAGNOSIS — I2089 Other forms of angina pectoris: Secondary | ICD-10-CM

## 2024-11-15 LAB — GLUCOSE, CAPILLARY: Glucose-Capillary: 77 mg/dL (ref 70–99)

## 2024-11-15 NOTE — Progress Notes (Addendum)
 Pre exercise CBG 153 via CGM. Post exercise CBG 78 per CGM. Patient asymptomatic said he started mounjaro  yesterday. Patient that he ate a bacon egg and cheese biscuit before coming to exercise at cardiac rehab. Patient was given orange juice and a banana. Repeat CBg via CGM 79. Ronald Bauer was given another orange juice. Asymptomatic. Repeat CBG 94 via CGM. Will continue to monitor the patient.Hadassah Elpidio Quan RN BSN

## 2024-11-17 ENCOUNTER — Telehealth: Payer: Self-pay | Admitting: Internal Medicine

## 2024-11-17 ENCOUNTER — Encounter (HOSPITAL_COMMUNITY)
Admission: RE | Admit: 2024-11-17 | Discharge: 2024-11-17 | Disposition: A | Discharge status: Home / Self Care | Source: Ambulatory Visit | Attending: Cardiology | Admitting: Cardiology

## 2024-11-17 ENCOUNTER — Telehealth (HOSPITAL_COMMUNITY): Payer: Self-pay | Admitting: *Deleted

## 2024-11-17 ENCOUNTER — Telehealth: Payer: Self-pay

## 2024-11-17 DIAGNOSIS — I2089 Other forms of angina pectoris: Secondary | ICD-10-CM

## 2024-11-17 LAB — GLUCOSE, CAPILLARY: Glucose-Capillary: 100 mg/dL — ABNORMAL HIGH (ref 70–99)

## 2024-11-17 NOTE — Progress Notes (Signed)
 Gianluca says that his CBG was 200 this morning before breakfast. Bravlio says he ate breakfast and fell asleep. Clayson says that his alarm on his CGM was going off. Jermon says his CBG was 47. Keiland says that he was asymptomatic at the time. Maleke says that he ate a can of Ravioli and a salad. Stpehen says he drove to drove to cardiac rehab with his CBG's in the 2's. Patient was given an orange juice. CBG 100 by hospital meter. No exercise due to low CBG per protocol. Patient's endocrinologist, Dr Sam office was called and notified. Left message on voice mail. Will forward today's event to Dr Lory for review.   Oxygen saturation 100% RA heart rate 59. Blood pressure 154/58. Medications reviewed taking as prescribed. Markevion says he took 30 units of Humalog  this morning but did not take his lunch time dose. Patient was given a piece of hard candy. CBG was 90 by Walt's CGM. Reviewed exercise CBG parameters of greater than 110 and less than 300. Patient states understanding.Hadassah Elpidio Quan RN BSN

## 2024-11-17 NOTE — Telephone Encounter (Signed)
 Spoke to pt informed him that Dr. Sam received a message from an RN stating that he was having Low Blood Sugars. I advise him that per Dr. Sam she would like him to  decrease his Humalog  from 30 units with a meal to 20 units with each meal.  It is very important that he takes this right before he eats and not after the meal pt stated that he understood and would decrease his humalog , Pt also stated that moving forward he can not afford Mounjaro .

## 2024-11-17 NOTE — Telephone Encounter (Signed)
 LMTRC  Ronald Bauer,RMA   *if someone can please call before 5 pm that would be great.

## 2024-11-17 NOTE — Telephone Encounter (Signed)
 Please let the patient know that I did receive a message from the registered nurse regarding his hypoglycemia.   Please ask him to decrease his Humalog  from 30 units with a meal to 20 units with each meal.  It is very important that he takes this right before he eats and not after the meal  Encouraged the patient to contact us  with persistent hypoglycemia   Thanks

## 2024-11-17 NOTE — Telephone Encounter (Signed)
-----   Message from Donell Butts, MD sent at 11/17/2024  1:59 PM EST ----- Noted, thank you ----- Message ----- From: Cyrus Hadassah ORN, RN Sent: 11/17/2024   1:18 PM EST To: Donell Redgie Butts, MD; [facility]  Good afternoon Dr Butts, I want to bring it to your attention that Mr Marzetta CBG's have been low since he started taking mounjaoro. Please see attached documentation. Mr Carmelo did not exercise today and had low post exercise CBG on 11/15/24. I left a message on your office voicemail to please contact the patient regarding this.  I appreciate your advisement.  Sincerely, Hadassah Cyrus RN Cardiac Rehab

## 2024-11-18 NOTE — Telephone Encounter (Signed)
 Patients wife says they cannot afford mounjaro  even with approval.  Advised patient to reach out to insurance to see if they have any other options.  Please advise if something different can be sent in.

## 2024-11-18 NOTE — Telephone Encounter (Signed)
 Patient aware to stop mounjaro  due to cost.

## 2024-11-18 NOTE — Addendum Note (Signed)
 Addended by: Devante Capano M on: 11/18/2024 10:11 AM   Modules accepted: Orders

## 2024-11-19 ENCOUNTER — Encounter (HOSPITAL_COMMUNITY)
Admission: RE | Admit: 2024-11-19 | Discharge: 2024-11-19 | Disposition: A | Discharge status: Home / Self Care | Source: Ambulatory Visit | Attending: Cardiology | Admitting: Cardiology

## 2024-11-19 DIAGNOSIS — I2089 Other forms of angina pectoris: Secondary | ICD-10-CM | POA: Diagnosis not present

## 2024-11-19 LAB — GLUCOSE, CAPILLARY: Glucose-Capillary: 121 mg/dL — ABNORMAL HIGH (ref 70–99)

## 2024-11-22 ENCOUNTER — Telehealth (HOSPITAL_COMMUNITY): Payer: Self-pay

## 2024-11-22 ENCOUNTER — Ambulatory Visit (HOSPITAL_BASED_OUTPATIENT_CLINIC_OR_DEPARTMENT_OTHER): Payer: Self-pay | Admitting: Cardiology

## 2024-11-22 ENCOUNTER — Encounter (HOSPITAL_COMMUNITY)
Admission: RE | Admit: 2024-11-22 | Discharge: 2024-11-22 | Disposition: A | Source: Ambulatory Visit | Attending: Cardiology

## 2024-11-22 DIAGNOSIS — I2089 Other forms of angina pectoris: Secondary | ICD-10-CM

## 2024-11-22 NOTE — Telephone Encounter (Signed)
 Left msg on pt's wife's voicemail enquiring if pt took his medications today as pt has endorsed forgetting to take his medications once in the past.  Pt's BP high today and had to be sent home as per policy.

## 2024-11-22 NOTE — Progress Notes (Signed)
 Incomplete Session Note  Patient Details  Name: Ronald Bauer MRN: [id number] Date of Birth: 21-Aug-1961 Referring Provider:   Flowsheet Row INTENSIVE CARDIAC REHAB ORIENT from 10/26/2024 in [facility]  Referring Provider Shelda Bruckner, MD    Ronald Bauer did not complete his rehab session.  Presented for CR and his initial BP was 174/60.  Pt endorsed taking his BP meds.  Upon recheck later it was still 178/50.  Sent home at this time.

## 2024-11-24 ENCOUNTER — Encounter (HOSPITAL_COMMUNITY)
Admission: RE | Admit: 2024-11-24 | Discharge: 2024-11-24 | Disposition: A | Source: Ambulatory Visit | Attending: Cardiology

## 2024-11-24 ENCOUNTER — Telehealth (HOSPITAL_BASED_OUTPATIENT_CLINIC_OR_DEPARTMENT_OTHER): Payer: Self-pay | Admitting: Cardiology

## 2024-11-24 DIAGNOSIS — I2089 Other forms of angina pectoris: Secondary | ICD-10-CM

## 2024-11-24 NOTE — Progress Notes (Addendum)
 Informed onsite provider that Ronald Bauer was stood down from cardiac rehab exercise on 11/22/24 d/t resting BP of 178/50. Resting BP today (11/24/24) still hypertensive, although systolic is down somewhat in the 150s. Exercise BP today was 188/78, upon recheck later 198/62. Pt has been historically hypertensive here at cardiac rehab, often in the 140s systolic at rest. Pt is on amlodopine, imdur , zestoretic, and metoprolol and may need his anti-hypertensives adjusted.  Onsite provider Ronald Hila, NP advised that Ronald. [name] is following him pretty closely. He was last seen in November and set for follow up in 2-3 months after getting starting in cardiac rehab. I think it would be best to get him scheduled for an in office visit. If he can check BP at home I would want him to bring log to office visit. BP at least once a day 2 hours after medications. Also, please remind him to limit sodium to <2000 mg a day.  Hold off on rehab until office visit.  If his PCP has been managing his BP than I am good with him seeing his PCP for further possible titration of hypertensive medications.    Called Ronald Bauer's office [phone] and spoke with patient engagement specialist Ronald Bauer in an attempt to get an office visit scheduled sooner than his current Feb 5th appointment.  No other available appointment at this time, but Ronald Bauer stated they would try to work him in sooner if possible and that they would call the pt to coordinate a sooner appointment time if one becomes available.  Called Ronald. Tanda Rummer' office, patient's PCP, in an attempt to see if they could see pt sooner than Feb 5th.  Spoke with registered nurse Ronald Bauer who stated they were managing Ronald Bauer's antihypertensive meds, but after comparing our current medication lists, she stated they did not have all of his current antihypertensive meds on their records.  Ronald Bauer also stated they would try to see if they could work pt in sooner than Feb 5th in  light of recommendation that Ronald Bauer not participate in cardiac rehab until seen in office for BP medication titration.

## 2024-11-24 NOTE — Progress Notes (Signed)
 " Cardiology Office Note:  .   Date:  11/25/2024  ID:  Ronald Bauer, DOB Dec 22, 1960, MRN [id number] PCP: [name] Ingle, MD  [facility] Providers Cardiologist:  Shelda Bruckner, MD {  History of Present Illness: .   Ronald Bauer is a 64 y.o. male with PMH abnormal stress test, hypertension, type II diabetes, dyslipidemia, obesity who is seen for follow up. I met him 07/27/24 as a new patient for LBBB at the request of Dr. [name].  CV history: Referral from 06/24/24 reviewed. Referral packet from Dr [name] showed ECG from 06/16/23 with SR, QRS 84 ms. ECG from 06-15-24 shows SR with new LBBB and QRS of 161 ms. No symptoms noted. Tchol 291, HDL 28, TG 1262, LDL unable to be calculated.  Has had diabetes since his mid 30s (weighed <150 lbs when he was diagnosed). Has been on insulin  for about 30 years (shortly after diagnosis), last A1c 7.1 06/2024. Hypertension has been a more recent issue, only in the last few years. Former tobacco, quit ~30 years ago.   FH: father had hypertension, diabetes, high cholesterol, MI. Diabetes runs throughout family, most of them have passed 2/2 complications from diabetes.  Cardiac PET 08/2024 with evidence of ischemia in inferior wall, reduced myocardial blood flow. Echo 08/2024 with EF 55-60%, G2DD. Read as global hypokinesis despite normal EF. On my review, EF appears ~50% on noncontrast images but ~60% on contrast images, wall motion difficult to see even with contrast. There does appear to be dyssynchrony from LBBB.  Today: Seen for urgent follow up at request of cardiac rehab. Blood pressures have been consistently elevated when measured at the program, and they would like him cleared prior to restarting.  Here, his blood pressure is 106/52 initially. I manually rechecked later and it was 162/42. His home cuff was off by 40 points, was reading much higher than the cuff at rehab. He takes all his medication in the morning, does take it before cardiac  rehab (takes early AM, cardiac rehab is around 12:15). He is trying to cut out salt but having a hard time with this.   Has not had any chest pain.  We also reviewed his severely elevated TG, recent visit with his endocrinologist and recommendations.  ROS:  No PND, orthopnea, worsening LE edema or unexpected weight gain. No syncope or palpitations. ROS otherwise negative except as noted.   Studies Reviewed: SABRA    EKG:       Physical Exam:   VS:  BP (!) 162/42 (BP Location: Left Arm, Cuff Size: Large)   Pulse 61   Ht 5' 11 (1.803 m)   Wt 284 lb 14.4 oz (129.2 kg)   SpO2 97%   BMI 39.74 kg/m    Wt Readings from Last 3 Encounters:  11/25/24 284 lb 14.4 oz (129.2 kg)  11/10/24 283 lb (128.4 kg)  10/26/24 287 lb 11.2 oz (130.5 kg)    GEN: Well nourished, well developed in no acute distress HEENT: Normal, moist mucous membranes NECK: No JVD CARDIAC: regular rhythm, normal S1 and S2, no rubs or gallops. No murmur. VASCULAR: Radial and DP pulses 2+ bilaterally. No carotid bruits RESPIRATORY:  Clear to auscultation without rales, wheezing or rhonchi  ABDOMEN: Soft, non-tender, non-distended MUSCULOSKELETAL:  Ambulates independently SKIN: Warm and dry, no edema NEUROLOGIC:  Alert and oriented x 3. No focal neuro deficits noted. PSYCHIATRIC:  Normal affect    ASSESSMENT AND PLAN: .    Hypertension, labile -wide pulse  pressure today/low diastolic. No aortic regurgitation on echo. We have to be careful not to lower diastolic more, so increasing vasodilators not ideal -had been well controlled, but has been persistently elevated at cardiac rehab. Labile today, initially 106/52, recheck was 160s/40s -on amlodipine 10 mg daily, lisinopril-hydrochlorothiazide 20-25 mg daily, metoprolol succinate 50 mg daily, imdur  30 mg daily -has been watching his salt, trying to cut back but this is difficult. -Given the following instructions: Take amlodipine at night. Take metoprolol, isosorbide ,  lisinopril-hydrochlorothiazide in the morning.  I am going to try to get you a home blood pressure cuff and also see if we can get a 24 hour blood pressure cuff.  We are going to give cardiac rehab goals: ok to start exercise if systolic is 160 or less (if elevated initially, recheck blood pressure after sitting), ok to continue to exercise if systolic blood pressure is less than 210. Does not need to be checked between exercises unless there is a clinical concern (symptoms, etc) that it is elevated. Expect that systolic blood pressure will benefit from continued exercise conditioning  Abnormal stress test, consistent with CAD Stable angina LBBB Type II diabetes, on insulin  for ~30 years Dyslipidemia, with severely elevated TG FH of heart disease Metabolic syndrome -no chest pain, dyspnea is anginal equivalent, minimally active -echo with normal LVEF but grade 2 diastolic dysfunction; on personal review, EF appears ~50% on noncontrast images but ~60% on contrast images, there is dyssynchrony from LBBB, difficult to assess wall motion even with contrast -cardiac PET with evidence of ischemia in inferior wall, reduced myocardial blood flow -severely elevated TG (1435) despite increase in fenofibrate  dose. Direct LDL 44, at goal.  -Now on higher dose of fenofibrate  and statin, glucose well controlled, meeting with dietician, increasing activity in cardiac rehab. Despite this, TG still severely elevated -will refer lipid clinic for possible genetic evaluation or olezarsen if he qualifies -has metabolic syndrome with abdominal adiposity, BMI 39, type II diabetes, dyslipidemia, hypertension. Appreciate Dr. Sam in endocrinology's management--has been started on low dose mounjaro , monitoring closely for symptoms of pancreatitis -anti anginals: on max dose amlodipine. On metoprolol succinate 50 mg daily, HR 50s, no room to increase. On low dose imdur . With low diastolic blood pressure, want to  avoid further increasing vasodilators -We have discussed cath. We discussed indications for stents. Also discussed that there is a likelihood that longstanding diabetes may have lead to severe multivessel disease -previous remote tobacco and heavy alcohol use, stopped with diabetes diagnosis decades ago -continue aspirin -discussed SLNG, how to use. He is not on PDE5i -lpa normal at <8.4 -reviewed red flag warning signs that need immediate medical attention  CV risk counseling and prevention -recommend heart healthy/Mediterranean diet, with whole grains, fruits, vegetable, fish, lean meats, nuts, and olive oil. Limit salt. -recommend moderate walking, 3-5 times/week for 30-50 minutes each session. Aim for at least 150 minutes/week. Goal should be pace of 3 miles/hours, or walking 1.5 miles in 30 minutes -recommend avoidance of tobacco products. Avoid excess alcohol.  Dispo: keep appt 12/09/24  Signed, Shelda Bruckner, MD   Shelda Bruckner, MD, PhD, [facility] [place]  [facility]  [place]  [facility] at [facility] [address] [place], [place] [address] [phone]   "

## 2024-11-24 NOTE — Telephone Encounter (Signed)
 Scheduled patient appointment tomorrow with Dr [name], per patient for blood pressure issues

## 2024-11-24 NOTE — Telephone Encounter (Signed)
 JD (RN) from Cardiac Rehab is requesting for patient 12/09/24 appointment to be moved to a sooner date due to cardiac findings, I was not able to find a sooner appointment. Notified nurse I would contact Dr Christopher's health care team to see if patient can be squeezed in. JD stated the patient would receive the call back to address appointment.  Please advised.

## 2024-11-25 ENCOUNTER — Encounter (HOSPITAL_BASED_OUTPATIENT_CLINIC_OR_DEPARTMENT_OTHER): Payer: Self-pay | Admitting: Cardiology

## 2024-11-25 ENCOUNTER — Ambulatory Visit (HOSPITAL_BASED_OUTPATIENT_CLINIC_OR_DEPARTMENT_OTHER): Admitting: Cardiology

## 2024-11-25 VITALS — BP 162/42 | HR 61 | Ht 71.0 in | Wt 284.9 lb

## 2024-11-25 DIAGNOSIS — I952 Hypotension due to drugs: Secondary | ICD-10-CM

## 2024-11-25 DIAGNOSIS — R9439 Abnormal result of other cardiovascular function study: Secondary | ICD-10-CM

## 2024-11-25 DIAGNOSIS — I25118 Atherosclerotic heart disease of native coronary artery with other forms of angina pectoris: Secondary | ICD-10-CM

## 2024-11-25 DIAGNOSIS — E782 Mixed hyperlipidemia: Secondary | ICD-10-CM | POA: Diagnosis not present

## 2024-11-25 DIAGNOSIS — E8881 Metabolic syndrome: Secondary | ICD-10-CM | POA: Diagnosis not present

## 2024-11-25 DIAGNOSIS — I447 Left bundle-branch block, unspecified: Secondary | ICD-10-CM | POA: Diagnosis not present

## 2024-11-25 DIAGNOSIS — R0989 Other specified symptoms and signs involving the circulatory and respiratory systems: Secondary | ICD-10-CM | POA: Diagnosis not present

## 2024-11-25 NOTE — Patient Instructions (Addendum)
 Take amlodipine at night. Take metoprolol, isosorbide , lisinopril-hydrochlorothiazide in the morning.  I am going to try to get you a home blood pressure cuff and also see if we can get a 24 hour blood pressure cuff. Call the number on the back of your insurance card to ask if you plan has 'over the counter' benefit to get BP cuff. If not, call us  and let us  know and our social work team can help coordinate you getting one.  We are going to give cardiac rehab goals: ok to start exercise if systolic is 160 or less (if elevated initially, recheck blood pressure after sitting), ok to continue to exercise if systolic blood pressure is less than 210. Does not need to be checked between exercises unless there is a clinical concern (symptoms, etc) that it is elevated. Expect that systolic blood pressure will benefit from continued exercise conditioning  To see if you qualify for extra help through Medicare, work with [employer] (senior health insurance information program) [facility] [employer] [facility] [place] [address] [place] [address] [phone] ext. [phone]

## 2024-11-26 ENCOUNTER — Encounter (HOSPITAL_COMMUNITY)
Admission: RE | Admit: 2024-11-26 | Discharge: 2024-11-26 | Disposition: A | Source: Ambulatory Visit | Attending: Cardiology

## 2024-11-26 DIAGNOSIS — I2089 Other forms of angina pectoris: Secondary | ICD-10-CM

## 2024-11-29 ENCOUNTER — Encounter (HOSPITAL_COMMUNITY)

## 2024-11-30 NOTE — Progress Notes (Signed)
 Cardiac Individual Treatment Plan  Patient Details  Name: Ronald Bauer MRN: [id number] Date of Birth: Apr 07, 1961 Referring Provider:   Flowsheet Row INTENSIVE CARDIAC REHAB ORIENT from 10/26/2024 in [facility], [facility]  Referring Provider Shelda Bruckner, MD    Initial Encounter Date:  Flowsheet Row INTENSIVE CARDIAC REHAB ORIENT from 10/26/2024 in [facility] for Heart, Vascular, & Lung Health  Date 10/26/24    Visit Diagnosis: Stable angina  Patient's Home Medications on Admission: Current Medications[1]  Past Medical History: Past Medical History:  Diagnosis Date   Diabetes mellitus without complication (HCC)    Hypertension    Thyroid disease     Tobacco Use: Tobacco Use History[2]  Labs: Review Flowsheet       Latest Ref Rng & Units 11/05/2024 11/10/2024  Labs for ITP Cardiac and Pulmonary Rehab  Cholestrol 100 - 199 mg/dL 738  -  LDL (calc) 0 - 99 mg/dL Comment  -  Direct LDL 0 - 99 mg/dL 44  -  HDL-C >60 mg/dL 16  -  Trlycerides 0 - 149 mg/dL 8,564  -  Hemoglobin J8r 4.0 - 5.6 % - 7.0     Capillary Blood Glucose: Lab Results  Component Value Date   GLUCAP 121 (H) 11/19/2024   GLUCAP 100 (H) 11/17/2024   GLUCAP 77 11/15/2024   GLUCAP 261 (H) 11/01/2024   GLUCAP 148 (H) 11/01/2024     Exercise Target Goals: Exercise Program Goal: Individual exercise prescription set using results from initial 6 min walk test and THRR while considering  patients activity barriers and safety.   Exercise Prescription Goal: Initial exercise prescription builds to 30-45 minutes a day of aerobic activity, 2-3 days per week.  Home exercise guidelines will be given to patient during program as part of exercise prescription that the participant will acknowledge.  Activity Barriers & Risk Stratification:  Activity Barriers & Cardiac Risk Stratification - 10/26/24 1416       Activity Barriers &  Cardiac Risk Stratification   Activity Barriers Arthritis;Deconditioning;Muscular Weakness;Balance Concerns    Cardiac Risk Stratification High          6 Minute Walk:  6 Minute Walk     Row Name 10/26/24 1516         6 Minute Walk   Phase Initial     Distance 850 feet     Walk Time 6 minutes     # of Rest Breaks 1  4:15-5:12 due to chronic rt hip pain     MPH 1.61     METS 1.63     RPE 11     Perceived Dyspnea  0     VO2 Peak 5.7     Symptoms Yes (comment)     Comments Chronic rt hip pain 6/10, chronic left ankle pain 4/10     Resting HR 63 bpm     Resting BP 142/50     Resting Oxygen Saturation  97 %     Exercise Oxygen Saturation  during 6 min walk 96 %     Max Ex. HR 84 bpm     Max Ex. BP 156/68     2 Minute Post BP 140/64        Oxygen Initial Assessment:   Oxygen Re-Evaluation:   Oxygen Discharge (Final Oxygen Re-Evaluation):   Initial Exercise Prescription:  Initial Exercise Prescription - 10/26/24 1400       Date of Initial Exercise  RX and Referring Provider   Date 10/26/24    Referring Provider Shelda Bruckner, MD    Expected Discharge Date 01/19/25      NuStep   Level 1    SPM 75    Minutes 15    METs 1.6      Arm Ergometer   Level 1    Watts 25    RPM 60    Minutes 15    METs 1.6      Prescription Details   Frequency (times per week) 3    Duration Progress to 30 minutes of continuous aerobic without signs/symptoms of physical distress      Intensity   THRR 40-80% of Max Heartrate 62-124    Ratings of Perceived Exertion 11-13    Perceived Dyspnea 0-4      Progression   Progression Continue progressive overload as per policy without signs/symptoms or physical distress.      Resistance Training   Training Prescription Yes    Reps 10-15          Perform Capillary Blood Glucose checks as needed.  Exercise Prescription Changes:   Exercise Prescription Changes     Row Name 11/01/24 1500 11/15/24 1400 11/26/24 1500          Response to Exercise   Blood Pressure (Admit) 130/64 140/70 156/64     Blood Pressure (Exercise) 162/74 158/86 174/74     Blood Pressure (Exit) 124/78 122/52 134/72     Heart Rate (Admit) 57 bpm 54 bpm 67 bpm     Heart Rate (Exercise) 104 bpm 108 bpm 100 bpm     Heart Rate (Exit) 66 bpm 65 bpm 71 bpm     Rating of Perceived Exertion (Exercise) 15 13 12      Symptoms None None None     Comments Pt's first day in the CRP2 program Reviewed METs Reviewed Goals and METs     Duration Continue with 30 min of aerobic exercise without signs/symptoms of physical distress. Continue with 30 min of aerobic exercise without signs/symptoms of physical distress. Continue with 30 min of aerobic exercise without signs/symptoms of physical distress.     Intensity THRR unchanged THRR unchanged THRR unchanged       Progression   Progression Continue to progress workloads to maintain intensity without signs/symptoms of physical distress. Continue to progress workloads to maintain intensity without signs/symptoms of physical distress. Continue to progress workloads to maintain intensity without signs/symptoms of physical distress.     Average METs 1.7 2 2.4       Resistance Training   Weight 3 lbs 3 lbs 4lbs     Reps 10-15 10-15 10-15     Time 5 Minutes 5 Minutes 5 Minutes       Interval Training   Interval Training No No No       NuStep   Level 1 1 1      SPM 74 103 103     Minutes 15 15 15      METs 1.9 2.4 2.7       Arm Ergometer   Level 1 1 1.5     Watts 14 17 20      RPM 58 70 60     Minutes 15 15 15      METs 1.5 1.6 1.9        Exercise Comments:   Exercise Comments     Row Name 11/01/24 1427 11/15/24 1434 11/26/24 1455       Exercise  Comments Pt's firs tday in the program. Pt tolerated session well and exercised without complaints. Reviewed METs. Pt is making progress on increasing his METs with activity. Reviewed METs and goals. Pt is making progress on his METs. Pt increased  workload on arm ergometer today and will increase on Nustep next session.        Exercise Goals and Review:   Exercise Goals     Row Name 10/26/24 1414             Exercise Goals   Increase Physical Activity Yes       Intervention Provide advice, education, support and counseling about physical activity/exercise needs.;Develop an individualized exercise prescription for aerobic and resistive training based on initial evaluation findings, risk stratification, comorbidities and participant's personal goals.       Expected Outcomes Short Term: Attend rehab on a regular basis to increase amount of physical activity.;Long Term: Exercising regularly at least 3-5 days a week.;Long Term: Add in home exercise to make exercise part of routine and to increase amount of physical activity.       Increase Strength and Stamina Yes       Intervention Provide advice, education, support and counseling about physical activity/exercise needs.;Develop an individualized exercise prescription for aerobic and resistive training based on initial evaluation findings, risk stratification, comorbidities and participant's personal goals.       Expected Outcomes Short Term: Increase workloads from initial exercise prescription for resistance, speed, and METs.;Short Term: Perform resistance training exercises routinely during rehab and add in resistance training at home;Long Term: Improve cardiorespiratory fitness, muscular endurance and strength as measured by increased METs and functional capacity ( )       Able to understand and use rate of perceived exertion (RPE) scale Yes       Intervention Provide education and explanation on how to use RPE scale       Expected Outcomes Short Term: Able to use RPE daily in rehab to express subjective intensity level;Long Term:  Able to use RPE to guide intensity level when exercising independently       Knowledge and understanding of Target Heart Rate Range (THRR) Yes        Intervention Provide education and explanation of THRR including how the numbers were predicted and where they are located for reference       Expected Outcomes Short Term: Able to state/look up THRR;Long Term: Able to use THRR to govern intensity when exercising independently;Short Term: Able to use daily as guideline for intensity in rehab       Understanding of Exercise Prescription Yes       Intervention Provide education, explanation, and written materials on patient's individual exercise prescription       Expected Outcomes Short Term: Able to explain program exercise prescription;Long Term: Able to explain home exercise prescription to exercise independently          Exercise Goals Re-Evaluation :  Exercise Goals Re-Evaluation     Row Name 11/01/24 1426 11/26/24 1447           Exercise Goal Re-Evaluation   Exercise Goals Review Increase Physical Activity;Increase Strength and Stamina;Able to understand and use rate of perceived exertion (RPE) scale;Knowledge and understanding of Target Heart Rate Range (THRR);Understanding of Exercise Prescription Increase Physical Activity;Increase Strength and Stamina;Able to understand and use rate of perceived exertion (RPE) scale;Knowledge and understanding of Target Heart Rate Range (THRR);Understanding of Exercise Prescription      Comments Pt's first day in the  CRP2 program. Pt understands the exercisr Rx, RPE sclae anf THRR. Reviewed MEts and goals. Pt voices progress on his goals of increased energy level, strength and increased upperbody strength. Pt has not made significant progress on his goal of weight loss. Pt has had the benefit of consult with dietician.      Expected Outcomes Will continue to monitor the patient and progress exercise workloads as tolerated. Will continue to monitor the patient and progress exercise workloads as tolerated.         Discharge Exercise Prescription (Final Exercise Prescription Changes):  Exercise  Prescription Changes - 11/26/24 1500       Response to Exercise   Blood Pressure (Admit) 156/64    Blood Pressure (Exercise) 174/74    Blood Pressure (Exit) 134/72    Heart Rate (Admit) 67 bpm    Heart Rate (Exercise) 100 bpm    Heart Rate (Exit) 71 bpm    Rating of Perceived Exertion (Exercise) 12    Symptoms None    Comments Reviewed Goals and METs    Duration Continue with 30 min of aerobic exercise without signs/symptoms of physical distress.    Intensity THRR unchanged      Progression   Progression Continue to progress workloads to maintain intensity without signs/symptoms of physical distress.    Average METs 2.4      Resistance Training   Weight 4lbs    Reps 10-15    Time 5 Minutes      Interval Training   Interval Training No      NuStep   Level 1    SPM 103    Minutes 15    METs 2.7      Arm Ergometer   Level 1.5    Watts 20    RPM 60    Minutes 15    METs 1.9          Nutrition:  Target Goals: Understanding of nutrition guidelines, daily intake of sodium 1500mg , cholesterol 200mg , calories 30% from fat and 7% or less from saturated fats, daily to have 5 or more servings of fruits and vegetables.  Biometrics:  Pre Biometrics - 10/26/24 1330       Pre Biometrics   Waist Circumference 55 inches    Hip Circumference 47.5 inches    Waist to Hip Ratio 1.16 %    Triceps Skinfold 11 mm    % Body Fat 37.5 %    Grip Strength 38 kg    Flexibility --   Not performedd back issues   Single Leg Stand 6.8 seconds           Nutrition Therapy Plan and Nutrition Goals:  Nutrition Therapy & Goals - 11/01/24 1345       Nutrition Therapy   Diet Heart Healthy      Personal Nutrition Goals   Nutrition Goal Patient to identify strategies for reducing cardiovascular risk by attending the Pritikin education and nutrition series weekly.    Personal Goal #2 Patient to improve diet quality by using the plate method as a guide for meal planning to include  lean protein/plant protein, fruits, vegetables, whole grains, nonfat dairy as part of a well-balanced diet.    Personal Goal #3 Patient to limit sodium intake to <2300 mg per day.    Comments Patient with medical history significant for abnormal stress test consistent with CAD, stable angina, hypertension, type II diabetes, dyslipidemia, obesity. Unhealthy eating pattern based on PYP score < 40. Pt reports  difficulty making dietary changes due to food preferences of family members; also reports getting most meals from restaurants. RD provided suggestions for increasing intake of fruit/vegetables as well as whole grains when eating out. Also discussed easy-to-prepare foods such as frozen veggies, canned tuna. RD encouraged pt to prepare meals at home when possible to help reduce sodium intake. Patient will benefit from participation in intensive cardiac rehab for nutrition education, exercise, and lifestyle modification.      Intervention Plan   Intervention Prescribe, educate and counsel regarding individualized specific dietary modifications aiming towards targeted core components such as weight, hypertension, lipid management, diabetes, heart failure and other comorbidities.;Nutrition handout(s) given to patient.   Handout: My Plate Planner   Expected Outcomes Short Term Goal: Understand basic principles of dietary content, such as calories, fat, sodium, cholesterol and nutrients.;Long Term Goal: Adherence to prescribed nutrition plan.          Nutrition Assessments:  MEDIFICTS Score Key: >=70 Need to make dietary changes  40-70 Heart Healthy Diet <= 40 Therapeutic Level Cholesterol Diet   Flowsheet Row INTENSIVE CARDIAC REHAB from 11/01/2024 in [facility] for Heart, Vascular, & Lung Health  Picture Your Plate Total Score on Admission 38   Picture Your Plate Scores: <59 Unhealthy dietary pattern with much room for improvement. 41-50 Dietary pattern unlikely to meet  recommendations for good health and room for improvement. 51-60 More healthful dietary pattern, with some room for improvement.  >60 Healthy dietary pattern, although there may be some specific behaviors that could be improved.    Nutrition Goals Re-Evaluation:  Nutrition Goals Re-Evaluation     Row Name 11/19/24 1446             Goals   Current Weight 286 lb 9.6 oz (130 kg)       Nutrition Goal Patient to identify strategies for reducing cardiovascular risk by attending the P[employer]ikin education and nutrition series weekly.       Comment Stable wt since starting cardiac rehab.       Expected Outcome Contemplative stage. Patient with medical history significant for abnormal stress test consistent with CAD, stable angina, hypertension, type II diabetes, dyslipidemia, obesity. Pt recently experiencing issues with glycemic control recently since starting Mounjaro . Unable to exercise on Wednesday due to exercise CBG < 110. Humalog  dosage reduced by endocrinologist; pre-workout CBG 121 today. Pt reports plans to discontinue Mounjaro  due to cost. Pt reports consuming Big Mac burger prior to coming to rehab today. RD provided suggestions for preworkout snacks to help maintain blood glucose. Also reviewed importance of including good sources of fiber for heart health, blood glucose management. Patient will benefit from ongoing participation in intensive cardiac rehab for nutrition education, exercise, and lifestyle modification.         Personal Goal #2 Re-Evaluation   Personal Goal #2 Patient to improve diet quality by using the plate method as a guide for meal planning to include lean protein/plant protein, fruits, vegetables, whole grains, nonfat dairy as part of a well-balanced diet.         Personal Goal #3 Re-Evaluation   Personal Goal #3 Patient to limit sodium intake to <2300 mg per day.          Nutrition Goals Re-Evaluation:  Nutrition Goals Re-Evaluation     Row Name 11/19/24 1446              Goals   Current Weight 286 lb 9.6 oz (130 kg)  Nutrition Goal Patient to identify strategies for reducing cardiovascular risk by attending the P[employer]ikin education and nutrition series weekly.       Comment Stable wt since starting cardiac rehab.       Expected Outcome Contemplative stage. Patient with medical history significant for abnormal stress test consistent with CAD, stable angina, hypertension, type II diabetes, dyslipidemia, obesity. Pt recently experiencing issues with glycemic control recently since starting Mounjaro . Unable to exercise on Wednesday due to exercise CBG < 110. Humalog  dosage reduced by endocrinologist; pre-workout CBG 121 today. Pt reports plans to discontinue Mounjaro  due to cost. Pt reports consuming Big Mac burger prior to coming to rehab today. RD provided suggestions for preworkout snacks to help maintain blood glucose. Also reviewed importance of including good sources of fiber for heart health, blood glucose management. Patient will benefit from ongoing participation in intensive cardiac rehab for nutrition education, exercise, and lifestyle modification.         Personal Goal #2 Re-Evaluation   Personal Goal #2 Patient to improve diet quality by using the plate method as a guide for meal planning to include lean protein/plant protein, fruits, vegetables, whole grains, nonfat dairy as part of a well-balanced diet.         Personal Goal #3 Re-Evaluation   Personal Goal #3 Patient to limit sodium intake to <2300 mg per day.          Nutrition Goals Discharge (Final Nutrition Goals Re-Evaluation):  Nutrition Goals Re-Evaluation - 11/19/24 1446       Goals   Current Weight 286 lb 9.6 oz (130 kg)    Nutrition Goal Patient to identify strategies for reducing cardiovascular risk by attending the [employer]in education and nutrition series weekly.    Comment Stable wt since starting cardiac rehab.    Expected Outcome Contemplative stage. Patient with  medical history significant for abnormal stress test consistent with CAD, stable angina, hypertension, type II diabetes, dyslipidemia, obesity. Pt recently experiencing issues with glycemic control recently since starting Mounjaro . Unable to exercise on Wednesday due to exercise CBG < 110. Humalog  dosage reduced by endocrinologist; pre-workout CBG 121 today. Pt reports plans to discontinue Mounjaro  due to cost. Pt reports consuming Big Mac burger prior to coming to rehab today. RD provided suggestions for preworkout snacks to help maintain blood glucose. Also reviewed importance of including good sources of fiber for heart health, blood glucose management. Patient will benefit from ongoing participation in intensive cardiac rehab for nutrition education, exercise, and lifestyle modification.      Personal Goal #2 Re-Evaluation   Personal Goal #2 Patient to improve diet quality by using the plate method as a guide for meal planning to include lean protein/plant protein, fruits, vegetables, whole grains, nonfat dairy as part of a well-balanced diet.      Personal Goal #3 Re-Evaluation   Personal Goal #3 Patient to limit sodium intake to <2300 mg per day.          Psychosocial: Target Goals: Acknowledge presence or absence of significant depression and/or stress, maximize coping skills, provide positive support system. Participant is able to verbalize types and ability to use techniques and skills needed for reducing stress and depression.  Initial Review & Psychosocial Screening:  Initial Psych Review & Screening - 10/26/24 1329       Initial Review   Current issues with Current Sleep Concerns;Current Stress Concerns    Source of Stress Concerns Chronic Illness      Family Dynamics   Good Support  System? Yes   Pt has wife, brother-in-law, and two best friends as a part of his support system     Barriers   Psychosocial barriers to participate in program The patient should benefit from  training in stress management and relaxation.      Screening Interventions   Interventions Encouraged to exercise;To provide support and resources with identified psychosocial needs;Provide feedback about the scores to participant    Expected Outcomes Long Term Goal: Stressors or current issues are controlled or eliminated.;Short Term goal: Identification and review with participant of any Quality of Life or Depression concerns found by scoring the questionnaire.;Long Term goal: The participant improves quality of Life and PHQ9 Scores as seen by post scores and/or verbalization of changes          Quality of Life Scores:  Quality of Life - 10/26/24 1413       Quality of Life   Select Quality of Life      Quality of Life Scores   Health/Function Pre 20.4 %    Socioeconomic Pre 30 %    Psych/Spiritual Pre 30 %    Family Pre 24 %    GLOBAL Pre 24.88 %         Scores of 19 and below usually indicate a poorer quality of life in these areas.  A difference of  2-3 points is a clinically meaningful difference.  A difference of 2-3 points in the total score of the Quality of Life Index has been associated with significant improvement in overall quality of life, self-image, physical symptoms, and general health in studies assessing change in quality of life.  PHQ-9: Review Flowsheet       10/26/2024 09/06/2024  Depression screen PHQ 2/9  Decreased Interest 0 0  Down, Depressed, Hopeless 0 0  PHQ - 2 Score 0 0  Altered sleeping 3 -  Tired, decreased energy 3 -  Change in appetite 3 -  Feeling bad or failure about yourself  0 -  Trouble concentrating 0 -  Moving slowly or fidgety/restless 0 -  Suicidal thoughts 0 -  PHQ-9 Score 9 -  Difficult doing work/chores Not difficult at all -   Interpretation of Total Score  Total Score Depression Severity:  1-4 = Minimal depression, 5-9 = Mild depression, 10-14 = Moderate depression, 15-19 = Moderately severe depression, 20-27 = Severe  depression   Psychosocial Evaluation and Intervention:   Psychosocial Re-Evaluation:  Psychosocial Re-Evaluation     Row Name 11/02/24 0943 11/26/24 1550           Psychosocial Re-Evaluation   Current issues with Current Sleep Concerns;Current Stress Concerns Current Sleep Concerns;Current Stress Concerns      Comments Ronald Bauer has not voiced any additional psychosocial concerns or stressors during exercise at cardiac rehab Ronald Bauer has not voiced any additional psychosocial concerns or stressors during exercise at cardiac rehab      Expected Outcomes Ronald Bauer will experience controlled or decreased psychosocial concerns or stressors by completion of cardiac rehab Ronald Bauer will experience controlled or decreased psychosocial concerns or stressors by completion of cardiac rehab      Interventions Stress management education;Encouraged to attend Cardiac Rehabilitation for the exercise;Relaxation education Stress management education;Encouraged to attend Cardiac Rehabilitation for the exercise;Relaxation education      Continue Psychosocial Services  Follow up required by staff Follow up required by staff         Psychosocial Discharge (Final Psychosocial Re-Evaluation):  Psychosocial Re-Evaluation - 11/26/24 1550  Psychosocial Re-Evaluation   Current issues with Current Sleep Concerns;Current Stress Concerns    Comments Ronald Bauer has not voiced any additional psychosocial concerns or stressors during exercise at cardiac rehab    Expected Outcomes Ronald Bauer will experience controlled or decreased psychosocial concerns or stressors by completion of cardiac rehab    Interventions Stress management education;Encouraged to attend Cardiac Rehabilitation for the exercise;Relaxation education    Continue Psychosocial Services  Follow up required by staff          Vocational Rehabilitation: Provide vocational rehab assistance to qualifying candidates.   Vocational Rehab Evaluation & Intervention:   Vocational Rehab - 10/26/24 1557       Initial Vocational Rehab Evaluation & Intervention   Assessment shows need for Vocational Rehabilitation No   Pt is retired         Education: Education Goals: Education classes will be provided on a weekly basis, covering required topics. Participant will state understanding/return demonstration of topics presented.    Education     Row Name 11/01/24 1400     Education   Cardiac Education Topics Pritikin   Nurse, Children's Exercise Physiologist   Select Psychosocial   Psychosocial How Our Thoughts Can Heal Our Hearts   Instruction Review Code 1- Verbalizes Understanding   Class Start Time 1400   Class Stop Time 1450   Class Time Calculation (min) 50 min    Row Name 11/03/24 1200     Education   Cardiac Education Topics Pritikin   Customer Service Manager   Weekly Topic Powerhouse Plant-Based Proteins   Instruction Review Code 1- Verbalizes Understanding   Class Start Time 1400   Class Stop Time 1440   Class Time Calculation (min) 40 min    Row Name 11/05/24 1400     Education   Cardiac Education Topics Pritikin   Select Core Videos     Core Videos   Educator Dietitian   Select Nutrition   Nutrition Facts on Fat   Instruction Review Code 1- Verbalizes Understanding   Class Start Time 1401   Class Stop Time 1437   Class Time Calculation (min) 36 min    Row Name 11/08/24 1400     Education   Cardiac Education Topics Pritikin   Geographical Information Systems Officer Psychosocial   Psychosocial Workshop From Head to Heart: The Power of a Healthy Outlook   Instruction Review Code 1- Verbalizes Understanding   Class Start Time 1145   Class Stop Time 1229   Class Time Calculation (min) 44 min    Row Name 11/12/24 1200     Education   Cardiac Education Topics Pritikin   Select Core Videos     Core Videos    Educator Dietitian   Select Nutrition   Nutrition Other   Instruction Review Code 1- Verbalizes Understanding   Class Start Time 1400   Class Stop Time 1440   Class Time Calculation (min) 40 min    Row Name 11/15/24 1300     Education   Cardiac Education Topics Pritikin   Glass Blower/designer Nutrition   Nutrition Workshop Label Reading   Instruction Review Code 1- Verbalizes Understanding   Class Start Time 1400   Class Stop Time 1445   Class Time  Calculation (min) 45 min    Row Name 11/19/24 1200     Education   Cardiac Education Topics Pritikin   Nurse, Children's Exercise Physiologist   Select Psychosocial   Psychosocial Healthy Minds, Bodies, Hearts   Instruction Review Code 1- Verbalizes Understanding   Class Start Time 1400   Class Stop Time 1440   Class Time Calculation (min) 40 min    Row Name 11/22/24 1300     Education   Cardiac Education Topics Pritikin   Engineer, Mining Education   General Education Heart Disease Risk Reduction   Instruction Review Code 1- Verbalizes Understanding   Class Start Time 1153   Class Stop Time 1233   Class Time Calculation (min) 40 min    Row Name 11/24/24 1300     Education   Cardiac Education Topics Pritikin   Customer Service Manager   Weekly Topic Fast and Healthy Breakfasts   Instruction Review Code 1- Verbalizes Understanding   Class Start Time 1400   Class Stop Time 1442   Class Time Calculation (min) 42 min    Row Name 11/26/24 1400     Education   Cardiac Education Topics Pritikin   Geographical Information Systems Officer Exercise   Exercise Workshop Location Manager and Fall Prevention   Instruction Review Code 1- Verbalizes Understanding   Class Start Time 1420   Class Stop Time 1500   Class Time  Calculation (min) 40 min      Core Videos: Exercise    Move It!  Clinical staff conducted group or individual video education with verbal and written material and guidebook.  Patient learns the recommended Pritikin exercise program. Exercise with the goal of living a long, healthy life. Some of the health benefits of exercise include controlled diabetes, healthier blood pressure levels, improved cholesterol levels, improved heart and lung capacity, improved sleep, and better body composition. Everyone should speak with their doctor before starting or changing an exercise routine.  Biomechanical Limitations Clinical staff conducted group or individual video education with verbal and written material and guidebook.  Patient learns how biomechanical limitations can impact exercise and how we can mitigate and possibly overcome limitations to have an impactful and balanced exercise routine.  Body Composition Clinical staff conducted group or individual video education with verbal and written material and guidebook.  Patient learns that body composition (ratio of muscle mass to fat mass) is a key component to assessing overall fitness, rather than body weight alone. Increased fat mass, especially visceral belly fat, can put us  at increased risk for metabolic syndrome, type 2 diabetes, heart disease, and even death. It is recommended to combine diet and exercise (cardiovascular and resistance training) to improve your body composition. Seek guidance from your physician and exercise physiologist before implementing an exercise routine.  Exercise Action Plan Clinical staff conducted group or individual video education with verbal and written material and guidebook.  Patient learns the recommended strategies to achieve and enjoy long-term exercise adherence, including variety, self-motivation, self-efficacy, and positive decision making. Benefits of exercise include fitness, good health, weight management,  more energy, better sleep, less stress, and overall well-being.  Medical   Heart Disease Risk Reduction Clinical staff conducted group or individual video education with  verbal and written material and guidebook.  Patient learns our heart is our most vital organ as it circulates oxygen, nutrients, white blood cells, and hormones throughout the entire body, and carries waste away. Data supports a plant-based eating plan like the Pritikin Program for its effectiveness in slowing progression of and reversing heart disease. The video provides a number of recommendations to address heart disease.   Metabolic Syndrome and Belly Fat  Clinical staff conducted group or individual video education with verbal and written material and guidebook.  Patient learns what metabolic syndrome is, how it leads to heart disease, and how one can reverse it and keep it from coming back. You have metabolic syndrome if you have 3 of the following 5 criteria: abdominal obesity, high blood pressure, high triglycerides, low HDL cholesterol, and high blood sugar.  Hypertension and Heart Disease Clinical staff conducted group or individual video education with verbal and written material and guidebook.  Patient learns that high blood pressure, or hypertension, is very common in the [place] . Hypertension is largely due to excessive salt intake, but other important risk factors include being overweight, physical inactivity, drinking too much alcohol, smoking, and not eating enough potassium from fruits and vegetables. High blood pressure is a leading risk factor for heart attack, stroke, congestive heart failure, dementia, kidney failure, and premature death. Long-term effects of excessive salt intake include stiffening of the arteries and thickening of heart muscle and organ damage. Recommendations include ways to reduce hypertension and the risk of heart disease.  Diseases of Our Time - Focusing on Diabetes Clinical staff  conducted group or individual video education with verbal and written material and guidebook.  Patient learns why the best way to stop diseases of our time is prevention, through food and other lifestyle changes. Medicine (such as prescription pills and surgeries) is often only a Band-Aid on the problem, not a long-term solution. Most common diseases of our time include obesity, type 2 diabetes, hypertension, heart disease, and cancer. The Pritikin Program is recommended and has been proven to help reduce, reverse, and/or prevent the damaging effects of metabolic syndrome.  Nutrition   Overview of the Pritikin Eating Plan  Clinical staff conducted group or individual video education with verbal and written material and guidebook.  Patient learns about the Pritikin Eating Plan for disease risk reduction. The Pritikin Eating Plan emphasizes a wide variety of unrefined, minimally-processed carbohydrates, like fruits, vegetables, whole grains, and legumes. Go, Caution, and Stop food choices are explained. Plant-based and lean animal proteins are emphasized. Rationale provided for low sodium intake for blood pressure control, low added sugars for blood sugar stabilization, and low added fats and oils for coronary artery disease risk reduction and weight management.  Calorie Density  Clinical staff conducted group or individual video education with verbal and written material and guidebook.  Patient learns about calorie density and how it impacts the Pritikin Eating Plan. Knowing the characteristics of the food you choose will help you decide whether those foods will lead to weight gain or weight loss, and whether you want to consume more or less of them. Weight loss is usually a side effect of the Pritikin Eating Plan because of its focus on low calorie-dense foods.  Label Reading  Clinical staff conducted group or individual video education with verbal and written material and guidebook.  Patient learns  about the Pritikin recommended label reading guidelines and corresponding recommendations regarding calorie density, added sugars, sodium content, and whole grains.  Dining Out -  Part 1  Clinical staff conducted group or individual video education with verbal and written material and guidebook.  Patient learns that restaurant meals can be sabotaging because they can be so high in calories, fat, sodium, and/or sugar. Patient learns recommended strategies on how to positively address this and avoid unhealthy pitfalls.  Facts on Fats  Clinical staff conducted group or individual video education with verbal and written material and guidebook.  Patient learns that lifestyle modifications can be just as effective, if not more so, as many medications for lowering your risk of heart disease. A Pritikin lifestyle can help to reduce your risk of inflammation and atherosclerosis (cholesterol build-up, or plaque, in the artery walls). Lifestyle interventions such as dietary choices and physical activity address the cause of atherosclerosis. A review of the types of fats and their impact on blood cholesterol levels, along with dietary recommendations to reduce fat intake is also included.  Nutrition Action Plan  Clinical staff conducted group or individual video education with verbal and written material and guidebook.  Patient learns how to incorporate Pritikin recommendations into their lifestyle. Recommendations include planning and keeping personal health goals in mind as an important part of their success.  Healthy Mind-Set    Healthy Minds, Bodies, Hearts  Clinical staff conducted group or individual video education with verbal and written material and guidebook.  Patient learns how to identify when they are stressed. Video will discuss the impact of that stress, as well as the many benefits of stress management. Patient will also be introduced to stress management techniques. The way we think, act, and  feel has an impact on our hearts.  How Our Thoughts Can Heal Our Hearts  Clinical staff conducted group or individual video education with verbal and written material and guidebook.  Patient learns that negative thoughts can cause depression and anxiety. This can result in negative lifestyle behavior and serious health problems. Cognitive behavioral therapy is an effective method to help control our thoughts in order to change and improve our emotional outlook.  Additional Videos:  Exercise    Improving Performance  Clinical staff conducted group or individual video education with verbal and written material and guidebook.  Patient learns to use a non-linear approach by alternating intensity levels and lengths of time spent exercising to help burn more calories and lose more body fat. Cardiovascular exercise helps improve heart health, metabolism, hormonal balance, blood sugar control, and recovery from fatigue. Resistance training improves strength, endurance, balance, coordination, reaction time, metabolism, and muscle mass. Flexibility exercise improves circulation, posture, and balance. Seek guidance from your physician and exercise physiologist before implementing an exercise routine and learn your capabilities and proper form for all exercise.  Introduction to Yoga  Clinical staff conducted group or individual video education with verbal and written material and guidebook.  Patient learns about yoga, a discipline of the coming together of mind, breath, and body. The benefits of yoga include improved flexibility, improved range of motion, better posture and core strength, increased lung function, weight loss, and positive self-image. Yogas heart health benefits include lowered blood pressure, healthier heart rate, decreased cholesterol and triglyceride levels, improved immune function, and reduced stress. Seek guidance from your physician and exercise physiologist before implementing an exercise  routine and learn your capabilities and proper form for all exercise.  Medical   Aging: Enhancing Your Quality of Life  Clinical staff conducted group or individual video education with verbal and written material and guidebook.  Patient learns key strategies and  recommendations to stay in good physical health and enhance quality of life, such as prevention strategies, having an advocate, securing a Health Care Proxy and Power of Attorney, and keeping a list of medications and system for tracking them. It also discusses how to avoid risk for bone loss.  Biology of Weight Control  Clinical staff conducted group or individual video education with verbal and written material and guidebook.  Patient learns that weight gain occurs because we consume more calories than we burn (eating more, moving less). Even if your body weight is normal, you may have higher ratios of fat compared to muscle mass. Too much body fat puts you at increased risk for cardiovascular disease, heart attack, stroke, type 2 diabetes, and obesity-related cancers. In addition to exercise, following the Pritikin Eating Plan can help reduce your risk.  Decoding Lab Results  Clinical staff conducted group or individual video education with verbal and written material and guidebook.  Patient learns that lab test reflects one measurement whose values change over time and are influenced by many factors, including medication, stress, sleep, exercise, food, hydration, pre-existing medical conditions, and more. It is recommended to use the knowledge from this video to become more involved with your lab results and evaluate your numbers to speak with your doctor.   Diseases of Our Time - Overview  Clinical staff conducted group or individual video education with verbal and written material and guidebook.  Patient learns that according to the CDC, 50% to 70% of chronic diseases (such as obesity, type 2 diabetes, elevated lipids, hypertension,  and heart disease) are avoidable through lifestyle improvements including healthier food choices, listening to satiety cues, and increased physical activity.  Sleep Disorders Clinical staff conducted group or individual video education with verbal and written material and guidebook.  Patient learns how good quality and duration of sleep are important to overall health and well-being. Patient also learns about sleep disorders and how they impact health along with recommendations to address them, including discussing with a physician.  Nutrition  Dining Out - Part 2 Clinical staff conducted group or individual video education with verbal and written material and guidebook.  Patient learns how to plan ahead and communicate in order to maximize their dining experience in a healthy and nutritious manner. Included are recommended food choices based on the type of restaurant the patient is visiting.   Fueling a Banker conducted group or individual video education with verbal and written material and guidebook.  There is a strong connection between our food choices and our health. Diseases like obesity and type 2 diabetes are very prevalent and are in large-part due to lifestyle choices. The Pritikin Eating Plan provides plenty of food and hunger-curbing satisfaction. It is easy to follow, affordable, and helps reduce health risks.  Menu Workshop  Clinical staff conducted group or individual video education with verbal and written material and guidebook.  Patient learns that restaurant meals can sabotage health goals because they are often packed with calories, fat, sodium, and sugar. Recommendations include strategies to plan ahead and to communicate with the manager, chef, or server to help order a healthier meal.  Planning Your Eating Strategy  Clinical staff conducted group or individual video education with verbal and written material and guidebook.  Patient learns about the  Pritikin Eating Plan and its benefit of reducing the risk of disease. The Pritikin Eating Plan does not focus on calories. Instead, it emphasizes high-quality, nutrient-rich foods. By knowing the characteristics  of the foods, we choose, we can determine their calorie density and make informed decisions.  Targeting Your Nutrition Priorities  Clinical staff conducted group or individual video education with verbal and written material and guidebook.  Patient learns that lifestyle habits have a tremendous impact on disease risk and progression. This video provides eating and physical activity recommendations based on your personal health goals, such as reducing LDL cholesterol, losing weight, preventing or controlling type 2 diabetes, and reducing high blood pressure.  Vitamins and Minerals  Clinical staff conducted group or individual video education with verbal and written material and guidebook.  Patient learns different ways to obtain key vitamins and minerals, including through a recommended healthy diet. It is important to discuss all supplements you take with your doctor.   Healthy Mind-Set    Smoking Cessation  Clinical staff conducted group or individual video education with verbal and written material and guidebook.  Patient learns that cigarette smoking and tobacco addiction pose a serious health risk which affects millions of people. Stopping smoking will significantly reduce the risk of heart disease, lung disease, and many forms of cancer. Recommended strategies for quitting are covered, including working with your doctor to develop a successful plan.  Culinary   Becoming a Set Designer conducted group or individual video education with verbal and written material and guidebook.  Patient learns that cooking at home can be healthy, cost-effective, quick, and puts them in control. Keys to cooking healthy recipes will include looking at your recipe, assessing your  equipment needs, planning ahead, making it simple, choosing cost-effective seasonal ingredients, and limiting the use of added fats, salts, and sugars.  Cooking - Breakfast and Snacks  Clinical staff conducted group or individual video education with verbal and written material and guidebook.  Patient learns how important breakfast is to satiety and nutrition through the entire day. Recommendations include key foods to eat during breakfast to help stabilize blood sugar levels and to prevent overeating at meals later in the day. Planning ahead is also a key component.  Cooking - Educational Psychologist conducted group or individual video education with verbal and written material and guidebook.  Patient learns eating strategies to improve overall health, including an approach to cook more at home. Recommendations include thinking of animal protein as a side on your plate rather than center stage and focusing instead on lower calorie dense options like vegetables, fruits, whole grains, and plant-based proteins, such as beans. Making sauces in large quantities to freeze for later and leaving the skin on your vegetables are also recommended to maximize your experience.  Cooking - Healthy Salads and Dressing Clinical staff conducted group or individual video education with verbal and written material and guidebook.  Patient learns that vegetables, fruits, whole grains, and legumes are the foundations of the Pritikin Eating Plan. Recommendations include how to incorporate each of these in flavorful and healthy salads, and how to create homemade salad dressings. Proper handling of ingredients is also covered. Cooking - Soups and [employer] - Soups and Desserts Clinical staff conducted group or individual video education with verbal and written material and guidebook.  Patient learns that Pritikin soups and desserts make for easy, nutritious, and delicious snacks and meal components that are  low in sodium, fat, sugar, and calorie density, while high in vitamins, minerals, and filling fiber. Recommendations include simple and healthy ideas for soups and desserts.   Overview     The Pritikin Solution  Program Overview Clinical staff conducted group or individual video education with verbal and written material and guidebook.  Patient learns that the results of the Pritikin Program have been documented in more than 100 articles published in peer-reviewed journals, and the benefits include reducing risk factors for (and, in some cases, even reversing) high cholesterol, high blood pressure, type 2 diabetes, obesity, and more! An overview of the three key pillars of the Pritikin Program will be covered: eating well, doing regular exercise, and having a healthy mind-set.  WORKSHOPS  Exercise: Exercise Basics: Building Your Action Plan Clinical staff led group instruction and group discussion with PowerPoint presentation and patient guidebook. To enhance the learning environment the use of posters, models and videos may be added. At the conclusion of this workshop, patients will comprehend the difference between physical activity and exercise, as well as the benefits of incorporating both, into their routine. Patients will understand the FITT (Frequency, Intensity, Time, and Type) principle and how to use it to build an exercise action plan. In addition, safety concerns and other considerations for exercise and cardiac rehab will be addressed by the presenter. The purpose of this lesson is to promote a comprehensive and effective weekly exercise routine in order to improve patients overall level of fitness.   Managing Heart Disease: Your Path to a Healthier Heart Clinical staff led group instruction and group discussion with PowerPoint presentation and patient guidebook. To enhance the learning environment the use of posters, models and videos may be added.At the conclusion of this workshop,  patients will understand the anatomy and physiology of the heart. Additionally, they will understand how Pritikins three pillars impact the risk factors, the progression, and the management of heart disease.  The purpose of this lesson is to provide a high-level overview of the heart, heart disease, and how the Pritikin lifestyle positively impacts risk factors.  Exercise Biomechanics Clinical staff led group instruction and group discussion with PowerPoint presentation and patient guidebook. To enhance the learning environment the use of posters, models and videos may be added. Patients will learn how the structural parts of their bodies function and how these functions impact their daily activities, movement, and exercise. Patients will learn how to promote a neutral spine, learn how to manage pain, and identify ways to improve their physical movement in order to promote healthy living. The purpose of this lesson is to expose patients to common physical limitations that impact physical activity. Participants will learn practical ways to adapt and manage aches and pains, and to minimize their effect on regular exercise. Patients will learn how to maintain good posture while sitting, walking, and lifting.  Balance Training and Fall Prevention  Clinical staff led group instruction and group discussion with PowerPoint presentation and patient guidebook. To enhance the learning environment the use of posters, models and videos may be added. At the conclusion of this workshop, patients will understand the importance of their sensorimotor skills (vision, proprioception, and the vestibular system) in maintaining their ability to balance as they age. Patients will apply a variety of balancing exercises that are appropriate for their current level of function. Patients will understand the common causes for poor balance, possible solutions to these problems, and ways to modify their physical environment  in order to minimize their fall risk. The purpose of this lesson is to teach patients about the importance of maintaining balance as they age and ways to minimize their risk of falling.  WORKSHOPS   Nutrition:  Fueling a Forensic Psychologist  Clinical staff led group instruction and group discussion with PowerPoint presentation and patient guidebook. To enhance the learning environment the use of posters, models and videos may be added. Patients will review the foundational principles of the Pritikin Eating Plan and understand what constitutes a serving size in each of the food groups. Patients will also learn Pritikin-friendly foods that are better choices when away from home and review make-ahead meal and snack options. Calorie density will be reviewed and applied to three nutrition priorities: weight maintenance, weight loss, and weight gain. The purpose of this lesson is to reinforce (in a group setting) the key concepts around what patients are recommended to eat and how to apply these guidelines when away from home by planning and selecting Pritikin-friendly options. Patients will understand how calorie density may be adjusted for different weight management goals.  Mindful Eating  Clinical staff led group instruction and group discussion with PowerPoint presentation and patient guidebook. To enhance the learning environment the use of posters, models and videos may be added. Patients will briefly review the concepts of the Pritikin Eating Plan and the importance of low-calorie dense foods. The concept of mindful eating will be introduced as well as the importance of paying attention to internal hunger signals. Triggers for non-hunger eating and techniques for dealing with triggers will be explored. The purpose of this lesson is to provide patients with the opportunity to review the basic principles of the Pritikin Eating Plan, discuss the value of eating mindfully and how to measure internal cues of hunger  and fullness using the Hunger Scale. Patients will also discuss reasons for non-hunger eating and learn strategies to use for controlling emotional eating.  Targeting Your Nutrition Priorities Clinical staff led group instruction and group discussion with PowerPoint presentation and patient guidebook. To enhance the learning environment the use of posters, models and videos may be added. Patients will learn how to determine their genetic susceptibility to disease by reviewing their family history. Patients will gain insight into the importance of diet as part of an overall healthy lifestyle in mitigating the impact of genetics and other environmental insults. The purpose of this lesson is to provide patients with the opportunity to assess their personal nutrition priorities by looking at their family history, their own health history and current risk factors. Patients will also be able to discuss ways of prioritizing and modifying the Pritikin Eating Plan for their highest risk areas  Menu  Clinical staff led group instruction and group discussion with PowerPoint presentation and patient guidebook. To enhance the learning environment the use of posters, models and videos may be added. Using menus brought in from [employer], or printed from [employer], patients will apply the Pritikin dining out guidelines that were presented in the [employer] video. Patients will also be able to practice these guidelines in a variety of provided scenarios. The purpose of this lesson is to provide patients with the opportunity to practice hands-on learning of the Pritikin Dining Out guidelines with actual menus and practice scenarios.  Label Reading Clinical staff led group instruction and group discussion with PowerPoint presentation and patient guidebook. To enhance the learning environment the use of posters, models and videos may be added. Patients will review and discuss the Pritikin label  reading guidelines presented in Pritikins Label Reading Educational series video. Using fool labels brought in from local grocery stores and markets, patients will apply the label reading guidelines and determine if the packaged food meet the Pritikin  guidelines. The purpose of this lesson is to provide patients with the opportunity to review, discuss, and practice hands-on learning of the Pritikin Label Reading guidelines with actual packaged food labels. Cooking School  Pritikins [employer] are designed to teach patients ways to prepare quick, simple, and affordable recipes at home. The importance of nutritions role in chronic disease risk reduction is reflected in its emphasis in the overall Pritikin program. By learning how to prepare essential core Pritikin Eating Plan recipes, patients will increase control over what they eat; be able to customize the flavor of foods without the use of added salt, sugar, or fat; and improve the quality of the food they consume. By learning a set of core recipes which are easily assembled, quickly prepared, and affordable, patients are more likely to prepare more healthy foods at home. These workshops focus on convenient breakfasts, simple entres, side dishes, and desserts which can be prepared with minimal effort and are consistent with nutrition recommendations for cardiovascular risk reduction. Cooking [employer] are taught by a armed forces logistics/support/administrative officer (RD) who has been trained by the [employer]. The chef or RD has a clear understanding of the importance of minimizing - if not completely eliminating - added fat, sugar, and sodium in recipes. Throughout the series of Cooking School Workshop sessions, patients will learn about healthy ingredients and efficient methods of cooking to build confidence in their capability to prepare    Cooking School weekly topics:  Adding Flavor- Sodium-Free  Fast and Healthy  Breakfasts  Powerhouse Plant-Based Proteins  Satisfying Salads and Dressings  Simple Sides and Sauces  International Cuisine-Spotlight on the [employer] Zones  Delicious Desserts  Savory Soups  [employer] - Meals in a Astronomer Appetizers and Snacks  Comforting Weekend Breakfasts  One-Pot Wonders   Fast Evening Meals  Landscape Architect Your Pritikin Plate  WORKSHOPS   Healthy Mindset (Psychosocial):  Focused Goals, Sustainable Changes Clinical staff led group instruction and group discussion with PowerPoint presentation and patient guidebook. To enhance the learning environment the use of posters, models and videos may be added. Patients will be able to apply effective goal setting strategies to establish at least one personal goal, and then take consistent, meaningful action toward that goal. They will learn to identify common barriers to achieving personal goals and develop strategies to overcome them. Patients will also gain an understanding of how our mind-set can impact our ability to achieve goals and the importance of cultivating a positive and growth-oriented mind-set. The purpose of this lesson is to provide patients with a deeper understanding of how to set and achieve personal goals, as well as the tools and strategies needed to overcome common obstacles which may arise along the way.  From Head to Heart: The Power of a Healthy Outlook  Clinical staff led group instruction and group discussion with PowerPoint presentation and patient guidebook. To enhance the learning environment the use of posters, models and videos may be added. Patients will be able to recognize and describe the impact of emotions and mood on physical health. They will discover the importance of self-care and explore self-care practices which may work for them. Patients will also learn how to utilize the 4 Cs to cultivate a healthier outlook and better manage stress and challenges. The  purpose of this lesson is to demonstrate to patients how a healthy outlook is an essential part of maintaining good health, especially as they continue their cardiac rehab  journey.  Healthy Sleep for a Healthy Heart Clinical staff led group instruction and group discussion with PowerPoint presentation and patient guidebook. To enhance the learning environment the use of posters, models and videos may be added. At the conclusion of this workshop, patients will be able to demonstrate knowledge of the importance of sleep to overall health, well-being, and quality of life. They will understand the symptoms of, and treatments for, common sleep disorders. Patients will also be able to identify daytime and nighttime behaviors which impact sleep, and they will be able to apply these tools to help manage sleep-related challenges. The purpose of this lesson is to provide patients with a general overview of sleep and outline the importance of quality sleep. Patients will learn about a few of the most common sleep disorders. Patients will also be introduced to the concept of sleep hygiene, and discover ways to self-manage certain sleeping problems through simple daily behavior changes. Finally, the workshop will motivate patients by clarifying the links between quality sleep and their goals of heart-healthy living.   Recognizing and Reducing Stress Clinical staff led group instruction and group discussion with PowerPoint presentation and patient guidebook. To enhance the learning environment the use of posters, models and videos may be added. At the conclusion of this workshop, patients will be able to understand the types of stress reactions, differentiate between acute and chronic stress, and recognize the impact that chronic stress has on their health. They will also be able to apply different coping mechanisms, such as reframing negative self-talk. Patients will have the opportunity to practice a variety of stress  management techniques, such as deep abdominal breathing, progressive muscle relaxation, and/or guided imagery.  The purpose of this lesson is to educate patients on the role of stress in their lives and to provide healthy techniques for coping with it.  Learning Barriers/Preferences:  Learning Barriers/Preferences - 10/26/24 1556       Learning Barriers/Preferences   Learning Barriers Hearing    Learning Preferences Individual Instruction;Group Instruction;Skilled Demonstration          Education Topics:  Knowledge Questionnaire Score:  Knowledge Questionnaire Score - 10/26/24 1414       Knowledge Questionnaire Score   Pre Score 23/24          Core Components/Risk Factors/Patient Goals at Admission:  Personal Goals and Risk Factors at Admission - 10/26/24 1557       Core Components/Risk Factors/Patient Goals on Admission    Weight Management Yes;Obesity;Weight Loss    Intervention Weight Management: Develop a combined nutrition and exercise program designed to reach desired caloric intake, while maintaining appropriate intake of nutrient and fiber, sodium and fats, and appropriate energy expenditure required for the weight goal.;Weight Management: Provide education and appropriate resources to help participant work on and attain dietary goals.;Weight Management/Obesity: Establish reasonable short term and long term weight goals.;Obesity: Provide education and appropriate resources to help participant work on and attain dietary goals.    Admit Weight 287 lb 11.2 oz (130.5 kg)    Goal Weight: Short Term 275 lb (124.7 kg)    Goal Weight: Long Term 200 lb (90.7 kg)    Expected Outcomes Short Term: Continue to assess and modify interventions until short term weight is achieved;Long Term: Adherence to nutrition and physical activity/exercise program aimed toward attainment of established weight goal;Weight Loss: Understanding of general recommendations for a balanced deficit meal plan,  which promotes 1-2 lb weight loss per week and includes a negative energy balance of  [phone] kcal/d;Understanding recommendations for meals to include 15-35% energy as protein, 25-35% energy from fat, 35-60% energy from carbohydrates, less than 200mg  of dietary cholesterol, 20-35 gm of total fiber daily;Understanding of distribution of calorie intake throughout the day with the consumption of 4-5 meals/snacks    Diabetes Yes    Intervention Provide education about signs/symptoms and action to take for hypo/hyperglycemia.;Provide education about proper nutrition, including hydration, and aerobic/resistive exercise prescription along with prescribed medications to achieve blood glucose in normal ranges: Fasting glucose 65-99 mg/dL    Expected Outcomes Short Term: Participant verbalizes understanding of the signs/symptoms and immediate care of hyper/hypoglycemia, proper foot care and importance of medication, aerobic/resistive exercise and nutrition plan for blood glucose control.;Long Term: Attainment of HbA1C < 7%.    Hypertension Yes    Intervention Provide education on lifestyle modifcations including regular physical activity/exercise, weight management, moderate sodium restriction and increased consumption of fresh fruit, vegetables, and low fat dairy, alcohol moderation, and smoking cessation.;Monitor prescription use compliance.    Expected Outcomes Short Term: Continued assessment and intervention until BP is < 140/37mm HG in hypertensive participants. < 130/77mm HG in hypertensive participants with diabetes, heart failure or chronic kidney disease.;Long Term: Maintenance of blood pressure at goal levels.    Lipids Yes    Intervention Provide education and support for participant on nutrition & aerobic/resistive exercise along with prescribed medications to achieve LDL 70mg , HDL >40mg .    Expected Outcomes Short Term: Participant states understanding of desired cholesterol values and is compliant  with medications prescribed. Participant is following exercise prescription and nutrition guidelines.;Long Term: Cholesterol controlled with medications as prescribed, with individualized exercise RX and with personalized nutrition plan. Value goals: LDL < 70mg , HDL > 40 mg.    Stress Yes    Intervention Offer individual and/or small group education and counseling on adjustment to heart disease, stress management and health-related lifestyle change. Teach and support self-help strategies.;Refer participants experiencing significant psychosocial distress to appropriate mental health specialists for further evaluation and treatment. When possible, include family members and significant others in education/counseling sessions.    Expected Outcomes Short Term: Participant demonstrates changes in health-related behavior, relaxation and other stress management skills, ability to obtain effective social support, and compliance with psychotropic medications if prescribed.;Long Term: Emotional wellbeing is indicated by absence of clinically significant psychosocial distress or social isolation.          Core Components/Risk Factors/Patient Goals Review:   Goals and Risk Factor Review     Row Name 11/02/24 0946 11/26/24 1551           Core Components/Risk Factors/Patient Goals Review   Personal Goals Review Diabetes;Hypertension;Lipids;Stress;Weight Management/Obesity Diabetes;Hypertension;Lipids;Stress;Weight Management/Obesity      Review Ronald Bauer has attended one cardiac rehab session thus far since his 11/01/24 start.  Vital signs have been stable, MET levels have maintained Ronald Bauer started cardiac rehab on 11/01/24 and is off to a good start with exercise. Vital signs have been stable.      Expected Outcomes Ronald Bauer will continue to particpate in cardiac rehab for exercise nutrition and lifestyle modifications. Ronald Bauer will continue to particpate in cardiac rehab for exercise nutrition and lifestyle  modifications.         Core Components/Risk Factors/Patient Goals at Discharge (Final Review):   Goals and Risk Factor Review - 11/26/24 1551       Core Components/Risk Factors/Patient Goals Review   Personal Goals Review Diabetes;Hypertension;Lipids;Stress;Weight Management/Obesity    Review Ronald Bauer started cardiac rehab on 11/01/24 and is off  to a good start with exercise. Vital signs have been stable.    Expected Outcomes Ronald Bauer will continue to particpate in cardiac rehab for exercise nutrition and lifestyle modifications.          ITP Comments:  ITP Comments     Row Name 10/26/24 1304 11/01/24 1436 11/02/24 0942 11/26/24 1549     ITP Comments Ronald Holland, MD: Medical Director.  Introduction to the Pritikin education Program/Intensive Cardiac Rehab.  Initial orientation packet reviewed with the patient. 30 day ITP Review. Ronald Bauer started cardiac rehab on 11/01/24. Ronald Bauer did well with exercise for his fitness level. 30 day ITP Review. Ronald Bauer started cardiac rehab on 11/01/24 and has attended 1 cardiac rehab session thus far.  Ronald Bauer did well with exercise for his fitness level. 30 day ITP Review. Ronald Bauer started cardiac rehab on 11/01/24 and is off to a good start with exercise.       Comments: see ITP comments    [1]  Current Outpatient Medications:    Accu-Chek FastClix Lancets MISC, Check blood sugar three times daily, Disp: 300 each, Rfl: 1   amLODipine (NORVASC) 10 MG tablet, Take 10 mg by mouth daily., Disp: , Rfl:    aspirin EC 81 MG tablet, Take 81 mg by mouth daily. Swallow whole., Disp: , Rfl:    Blood Glucose Monitoring Suppl (ACCU-CHEK GUIDE) w/Device KIT, 1 Device by Does not apply route 3 (three) times daily., Disp: 1 kit, Rfl: 0   cetirizine (ZYRTEC) 10 MG tablet, Take 10 mg by mouth daily., Disp: , Rfl:    Continuous Glucose Sensor (DEXCOM G7 SENSOR) MISC, 1 Device by Does not apply route as directed. Change sensor every 10 days, Disp: 9 each, Rfl: 3   fenofibrate   micronized (LOFIBRA) 200 MG capsule, Take 1 capsule (200 mg total) by mouth daily before breakfast., Disp: 90 capsule, Rfl: 3   glucose blood (ACCU-CHEK GUIDE TEST) test strip, 1 each by Other route 3 (three) times daily. Use as instructed, Disp: 300 each, Rfl: 12   HYDROcodone-acetaminophen (NORCO) 7.5-325 MG tablet, Take 1 tablet by mouth 3 (three) times daily., Disp: , Rfl:    ibuprofen (ADVIL) 600 MG tablet, Take 600 mg by mouth every 8 (eight) hours as needed (pain)., Disp: , Rfl:    insulin  glargine, 2 Unit Dial, (TOUJEO  MAX SOLOSTAR) 300 UNIT/ML Solostar Pen, Inject 100 Units into the skin daily in the afternoon., Disp: 15 mL, Rfl: 3   insulin  lispro (HUMALOG  KWIKPEN) 200 UNIT/ML KwikPen, Inject 30 Units into the skin 3 (three) times daily., Disp: 15 mL, Rfl: 4   Insulin  Pen Needle 32G X 4 MM MISC, 1 Device by Does not apply route in the morning, at noon, in the evening, and at bedtime., Disp: 400 each, Rfl: 3   isosorbide  mononitrate (IMDUR ) 30 MG 24 hr tablet, Take 1 tablet (30 mg total) by mouth daily., Disp: 90 tablet, Rfl: 3   levothyroxine (SYNTHROID) 50 MCG tablet, Take 50 mcg by mouth daily., Disp: , Rfl:    lisinopril-hydrochlorothiazide (ZESTORETIC) 20-25 MG tablet, Take 1 tablet by mouth daily., Disp: , Rfl:    metoprolol succinate (TOPROL-XL) 50 MG 24 hr tablet, Take 50 mg by mouth daily., Disp: , Rfl:    nitroGLYCERIN  (NITROSTAT ) 0.4 MG SL tablet, Place 1 tablet (0.4 mg total) under the tongue every 5 (five) minutes as needed for chest pain., Disp: 25 tablet, Rfl: 3   rosuvastatin  (CRESTOR ) 20 MG tablet, Take 1 tablet (20 mg total) by mouth  daily., Disp: 90 tablet, Rfl: 3   sertraline (ZOLOFT) 50 MG tablet, Take 50 mg by mouth daily., Disp: , Rfl:  [2]  Social History Tobacco Use  Smoking Status Never   Passive exposure: Current  Smokeless Tobacco Never

## 2024-12-01 ENCOUNTER — Encounter (HOSPITAL_COMMUNITY)
Admission: RE | Admit: 2024-12-01 | Discharge: 2024-12-01 | Disposition: A | Source: Ambulatory Visit | Attending: Cardiology

## 2024-12-01 DIAGNOSIS — I2089 Other forms of angina pectoris: Secondary | ICD-10-CM

## 2024-12-01 LAB — GLUCOSE, CAPILLARY: Glucose-Capillary: 83 mg/dL (ref 70–99)

## 2024-12-01 NOTE — Progress Notes (Addendum)
 Incomplete Session Note  Patient Details  Name: Ronald Bauer MRN: [id number] Date of Birth: 1961-10-21 Referring Provider:   Flowsheet Row INTENSIVE CARDIAC REHAB ORIENT from 10/26/2024 in [facility]  Referring Provider Shelda Bruckner, MD    Beryl JONELLE Gainer did not complete his rehab session.  Pt presented with a BGL of 83 and was sent home without exercising as per hypoglycemic protocol.  Given an orange juice and crackers.  BP 174/60 @ rest.

## 2024-12-03 ENCOUNTER — Encounter (HOSPITAL_COMMUNITY)
Admission: RE | Admit: 2024-12-03 | Discharge: 2024-12-03 | Disposition: A | Source: Ambulatory Visit | Attending: Cardiology

## 2024-12-03 DIAGNOSIS — I2089 Other forms of angina pectoris: Secondary | ICD-10-CM

## 2024-12-03 LAB — GLUCOSE, CAPILLARY: Glucose-Capillary: 245 mg/dL — ABNORMAL HIGH (ref 70–99)

## 2024-12-06 ENCOUNTER — Encounter (HOSPITAL_COMMUNITY)

## 2024-12-08 ENCOUNTER — Encounter (HOSPITAL_COMMUNITY)
Admission: RE | Admit: 2024-12-08 | Discharge: 2024-12-08 | Disposition: A | Source: Ambulatory Visit | Attending: Cardiology

## 2024-12-08 LAB — GLUCOSE, CAPILLARY: Glucose-Capillary: 99 mg/dL (ref 70–99)

## 2024-12-09 ENCOUNTER — Encounter (HOSPITAL_BASED_OUTPATIENT_CLINIC_OR_DEPARTMENT_OTHER): Payer: Self-pay | Admitting: Cardiology

## 2024-12-09 ENCOUNTER — Other Ambulatory Visit: Payer: Self-pay | Admitting: Internal Medicine

## 2024-12-09 ENCOUNTER — Ambulatory Visit (HOSPITAL_BASED_OUTPATIENT_CLINIC_OR_DEPARTMENT_OTHER): Admitting: Cardiology

## 2024-12-09 VITALS — BP 138/52 | HR 54 | Ht 71.0 in | Wt 289.0 lb

## 2024-12-09 DIAGNOSIS — I25118 Atherosclerotic heart disease of native coronary artery with other forms of angina pectoris: Secondary | ICD-10-CM

## 2024-12-09 DIAGNOSIS — E782 Mixed hyperlipidemia: Secondary | ICD-10-CM

## 2024-12-09 DIAGNOSIS — R0989 Other specified symptoms and signs involving the circulatory and respiratory systems: Secondary | ICD-10-CM

## 2024-12-09 DIAGNOSIS — E8881 Metabolic syndrome: Secondary | ICD-10-CM | POA: Diagnosis not present

## 2024-12-09 DIAGNOSIS — R9439 Abnormal result of other cardiovascular function study: Secondary | ICD-10-CM | POA: Diagnosis not present

## 2024-12-09 DIAGNOSIS — I447 Left bundle-branch block, unspecified: Secondary | ICD-10-CM | POA: Diagnosis not present

## 2024-12-09 NOTE — Progress Notes (Signed)
 " Cardiology Office Note:  .   Date:  12/09/2024  ID:  Ronald Bauer, DOB [birth year], MRN [id number] PCP: [name] Ingle, MD  [facility] Providers Cardiologist:  Shelda Bruckner, MD {  History of Present Illness: .   Ronald Bauer is a 64 y.o. male with PMH abnormal stress test, hypertension, type II diabetes, dyslipidemia, obesity who is seen for follow up. I met him 07/27/24 as a new patient for LBBB at the request of Dr. [name].  CV history: Referral from 06/24/24 reviewed. Referral packet from Dr [name] showed ECG from 06/16/23 with SR, QRS 84 ms. ECG from 06-15-24 shows SR with new LBBB and QRS of 161 ms. No symptoms noted. Tchol 291, HDL 28, TG 1262, LDL unable to be calculated.  Has had diabetes since his mid 30s (weighed <150 lbs when he was diagnosed). Has been on insulin  for about 30 years (shortly after diagnosis), last A1c 7.1 06/2024. Hypertension has been a more recent issue, only in the last few years. Former tobacco, quit ~30 years ago.   FH: father had hypertension, diabetes, high cholesterol, MI. Diabetes runs throughout family, most of them have passed 2/2 complications from diabetes.  Cardiac PET 08/2024 with evidence of ischemia in inferior wall, reduced myocardial blood flow. Echo 08/2024 with EF 55-60%, G2DD. Read as global hypokinesis despite normal EF. On my review, EF appears ~50% on noncontrast images but ~60% on contrast images, wall motion difficult to see even with contrast. There does appear to be dyssynchrony from LBBB.  Today: Has been doing well in cardiac rehab. Has not had to not exercise due to blood pressures. Reviewed recent vitals from them--154/54 at rest, 186/86 max exercise on the nu-step, 128/54 during recovery. No lightheadedness. He feels like he is getting stronger with this. Shortness of breath is improved.  Hasn't heard from ambulatory blood pressure cuff company. We will look into this.  Had two falls from slipping on the ice, landed  on his elbow both times.  Reviewed nutrition recommendations, healthy food swaps. Making better choices at restaurants. Learning a lot from cooking classes in cardiac rehab but historically he only cooks on Sundays, has been a big change for him.  ROS:  No chest pain, PND, orthopnea, worsening LE edema or unexpected weight gain. No syncope or palpitations. ROS otherwise negative except as noted.   Studies Reviewed: SABRA    EKG:       Physical Exam:   VS:  BP (!) 138/52 (BP Location: Left Arm, Patient Position: Sitting, Cuff Size: Large)   Pulse (!) 54   Ht 5' 11 (1.803 m)   Wt 289 lb (131.1 kg)   SpO2 95%   BMI 40.31 kg/m    Wt Readings from Last 3 Encounters:  12/09/24 289 lb (131.1 kg)  11/25/24 284 lb 14.4 oz (129.2 kg)  11/10/24 283 lb (128.4 kg)    GEN: Well nourished, well developed in no acute distress HEENT: Normal, moist mucous membranes NECK: No JVD CARDIAC: regular rhythm, normal S1 and S2, no rubs or gallops. No murmur. VASCULAR: Radial and DP pulses 2+ bilaterally. No carotid bruits RESPIRATORY:  Clear to auscultation without rales, wheezing or rhonchi  ABDOMEN: Soft, non-tender, non-distended MUSCULOSKELETAL:  Ambulates independently SKIN: Warm and dry, no edema NEUROLOGIC:  Alert and oriented x 3. No focal neuro deficits noted. PSYCHIATRIC:  Normal affect    ASSESSMENT AND PLAN: .    Hypertension, labile -wide pulse pressure/low diastolic. No aortic regurgitation on echo.  We have to be careful not to lower diastolic more, so increasing vasodilators not ideal -had been well controlled, but had been persistently elevated at cardiac rehab. Gave new parameters and has been doing well. See example above -on amlodipine 10 mg daily (takes at night), lisinopril-hydrochlorothiazide 20-25 mg daily, metoprolol succinate 50 mg daily, imdur  30 mg daily (rest of meds in the AM) -has been watching his salt, trying to cut back but this is difficult. -trying to get 24 hour  ambulatory blood pressure cuff for him  cardiac rehab goals:  ok to start exercise if systolic is 160 or less (if elevated initially, recheck blood pressure after sitting), ok to continue to exercise if systolic blood pressure is less than 210. Does not need to be checked between exercises unless there is a clinical concern (symptoms, etc) that it is elevated. Expect that systolic blood pressure will benefit from continued exercise conditioning  Abnormal stress test, consistent with CAD Stable angina LBBB Type II diabetes, on insulin  for ~30 years Dyslipidemia, with severely elevated TG FH of heart disease Metabolic syndrome -no chest pain, dyspnea is anginal equivalent, minimally active -echo with normal LVEF but grade 2 diastolic dysfunction; on personal review, EF appears ~50% on noncontrast images but ~60% on contrast images, there is dyssynchrony from LBBB, difficult to assess wall motion even with contrast -cardiac PET with evidence of ischemia in inferior wall, reduced myocardial blood flow -severely elevated TG (1435) despite increase in fenofibrate  dose. Direct LDL 44, at goal.  -Now on higher dose of fenofibrate  and statin, glucose well controlled, meeting with dietician, increasing activity in cardiac rehab. Despite this, TG still severely elevated -pending appt with Dr. Mona at lipid clinic for possible genetic evaluation or olezarsen if he qualifies -has metabolic syndrome with abdominal adiposity, BMI 39, type II diabetes, dyslipidemia, hypertension. Appreciate Dr. Sam in endocrinology's management--ordered for mounjaro , but deductible was $1000 which he can't afford. They have the paperwork about spreading the payments out but not sure how to do this. -anti anginals: on max dose amlodipine. On metoprolol succinate 50 mg daily, HR 50s, no room to increase. On low dose imdur . With low diastolic blood pressure, want to avoid further increasing vasodilators -We have discussed  cath. We discussed indications for stents. Also discussed that there is a likelihood that longstanding diabetes may have lead to severe multivessel disease -previous remote tobacco and heavy alcohol use, stopped with diabetes diagnosis decades ago -continue aspirin -discussed SLNG, how to use. He is not on PDE5i -lpa normal at <8.4 -reviewed red flag warning signs that need immediate medical attention  CV risk counseling and prevention -recommend heart healthy/Mediterranean diet, with whole grains, fruits, vegetable, fish, lean meats, nuts, and olive oil. Limit salt. -recommend moderate walking, 3-5 times/week for 30-50 minutes each session. Aim for at least 150 minutes/week. Goal should be pace of 3 miles/hours, or walking 1.5 miles in 30 minutes -recommend avoidance of tobacco products. Avoid excess alcohol.  Dispo: 3 months or sooner as needed  Signed, Shelda Bruckner, MD   Shelda Bruckner, MD, PhD, [facility]  [facility]    Heart & Vascular at [facility] at [facility] [address], [address] [place], [place] [address] [phone]   "

## 2024-12-09 NOTE — Patient Instructions (Signed)
 Medication Instructions:  No changes *If you need a refill on your cardiac medications before your next appointment, please call your pharmacy*  Lab Work: none   Testing/Procedures: none  Follow-Up: At [facility], you and your health needs are our priority.  As part of our continuing mission to provide you with exceptional heart care, our providers are all part of one team.  This team includes your primary Cardiologist (physician) and Advanced Practice Providers or APPs (Physician Assistants and Nurse Practitioners) who all work together to provide you with the care you need, when you need it.  Your next appointment:   3 month(s)  Provider:   Shelda Bruckner, MD, Rosaline Bane, NP, or Reche Finder, NP

## 2024-12-10 ENCOUNTER — Encounter (HOSPITAL_COMMUNITY): Admission: RE | Admit: 2024-12-10 | Source: Ambulatory Visit

## 2024-12-10 DIAGNOSIS — I2089 Other forms of angina pectoris: Secondary | ICD-10-CM

## 2024-12-13 ENCOUNTER — Encounter (HOSPITAL_COMMUNITY)

## 2024-12-13 ENCOUNTER — Ambulatory Visit

## 2024-12-15 ENCOUNTER — Encounter (HOSPITAL_COMMUNITY)

## 2024-12-17 ENCOUNTER — Encounter (HOSPITAL_COMMUNITY)

## 2024-12-20 ENCOUNTER — Encounter (HOSPITAL_COMMUNITY)

## 2024-12-22 ENCOUNTER — Encounter (HOSPITAL_COMMUNITY)

## 2024-12-24 ENCOUNTER — Encounter (HOSPITAL_COMMUNITY)

## 2024-12-27 ENCOUNTER — Encounter (HOSPITAL_COMMUNITY)

## 2024-12-29 ENCOUNTER — Institutional Professional Consult (permissible substitution) (HOSPITAL_BASED_OUTPATIENT_CLINIC_OR_DEPARTMENT_OTHER): Admitting: Internal Medicine

## 2024-12-29 ENCOUNTER — Encounter (HOSPITAL_COMMUNITY)

## 2024-12-31 ENCOUNTER — Encounter (HOSPITAL_COMMUNITY)

## 2025-01-03 ENCOUNTER — Encounter (HOSPITAL_COMMUNITY)

## 2025-01-05 ENCOUNTER — Encounter (HOSPITAL_COMMUNITY)

## 2025-01-07 ENCOUNTER — Encounter (HOSPITAL_COMMUNITY)

## 2025-01-10 ENCOUNTER — Encounter (HOSPITAL_COMMUNITY)

## 2025-01-12 ENCOUNTER — Encounter (HOSPITAL_COMMUNITY)

## 2025-01-14 ENCOUNTER — Encounter (HOSPITAL_COMMUNITY)

## 2025-01-17 ENCOUNTER — Encounter (HOSPITAL_COMMUNITY)

## 2025-01-19 ENCOUNTER — Encounter (HOSPITAL_COMMUNITY)

## 2025-02-16 ENCOUNTER — Ambulatory Visit: Admitting: Internal Medicine

## 2025-03-17 ENCOUNTER — Ambulatory Visit (HOSPITAL_BASED_OUTPATIENT_CLINIC_OR_DEPARTMENT_OTHER): Admitting: Cardiology

## 2025-04-04 ENCOUNTER — Encounter: Admitting: Dietician
